# Patient Record
Sex: Female | Born: 1970 | Race: Black or African American | Hispanic: No | Marital: Married | Smoking: Never smoker
Health system: Southern US, Community
[De-identification: ages and names within clinical notes are randomized; demographics above are authoritative.]

## PROBLEM LIST (undated history)

## (undated) DIAGNOSIS — R87629 Unspecified abnormal cytological findings in specimens from vagina: Secondary | ICD-10-CM

## (undated) DIAGNOSIS — B009 Herpesviral infection, unspecified: Secondary | ICD-10-CM

## (undated) DIAGNOSIS — N6009 Solitary cyst of unspecified breast: Secondary | ICD-10-CM

## (undated) DIAGNOSIS — N809 Endometriosis, unspecified: Secondary | ICD-10-CM

## (undated) DIAGNOSIS — M810 Age-related osteoporosis without current pathological fracture: Secondary | ICD-10-CM

## (undated) DIAGNOSIS — K429 Umbilical hernia without obstruction or gangrene: Secondary | ICD-10-CM

## (undated) DIAGNOSIS — I10 Essential (primary) hypertension: Secondary | ICD-10-CM

## (undated) HISTORY — DX: Endometriosis, unspecified: N80.9

## (undated) HISTORY — DX: Herpesviral infection, unspecified: B00.9

## (undated) HISTORY — PX: OTHER SURGICAL HISTORY: SHX169

## (undated) HISTORY — DX: Solitary cyst of unspecified breast: N60.09

## (undated) HISTORY — PX: LEEP: SHX91

## (undated) HISTORY — DX: Umbilical hernia without obstruction or gangrene: K42.9

## (undated) HISTORY — DX: Unspecified abnormal cytological findings in specimens from vagina: R87.629

## (undated) HISTORY — PX: TUBAL LIGATION: SHX77

## (undated) HISTORY — PX: KNEE SURGERY: SHX244

## (undated) HISTORY — PX: HERNIA REPAIR: SHX51

## (undated) HISTORY — DX: Age-related osteoporosis without current pathological fracture: M81.0

---

## 2006-02-07 ENCOUNTER — Emergency Department (HOSPITAL_COMMUNITY): Admission: EM | Admit: 2006-02-07 | Discharge: 2006-02-07 | Payer: Self-pay | Admitting: Emergency Medicine

## 2007-10-11 ENCOUNTER — Encounter: Payer: Self-pay | Admitting: Maternal & Fetal Medicine

## 2007-11-18 HISTORY — PX: TUBAL LIGATION: SHX77

## 2011-07-15 ENCOUNTER — Ambulatory Visit: Payer: Self-pay | Admitting: Obstetrics and Gynecology

## 2011-09-17 ENCOUNTER — Ambulatory Visit: Payer: Self-pay | Admitting: Otolaryngology

## 2012-09-27 ENCOUNTER — Ambulatory Visit: Payer: Self-pay | Admitting: Surgery

## 2012-09-27 LAB — CBC WITH DIFFERENTIAL/PLATELET
Basophil #: 0.1 10*3/uL (ref 0.0–0.1)
HCT: 35.8 % (ref 35.0–47.0)
Lymphocyte %: 28.9 %
Monocyte %: 6.7 %
Platelet: 285 10*3/uL (ref 150–440)
RDW: 14 % (ref 11.5–14.5)
WBC: 9.2 10*3/uL (ref 3.6–11.0)

## 2012-09-27 LAB — BASIC METABOLIC PANEL
Anion Gap: 5 — ABNORMAL LOW (ref 7–16)
BUN: 18 mg/dL (ref 7–18)
Calcium, Total: 8.7 mg/dL (ref 8.5–10.1)
Chloride: 105 mmol/L (ref 98–107)
Creatinine: 0.72 mg/dL (ref 0.60–1.30)
Glucose: 72 mg/dL (ref 65–99)
Osmolality: 276 (ref 275–301)
Potassium: 3.8 mmol/L (ref 3.5–5.1)

## 2012-09-30 ENCOUNTER — Ambulatory Visit: Payer: Self-pay | Admitting: Surgery

## 2012-11-17 LAB — HM PAP SMEAR: HM Pap smear: NEGATIVE

## 2012-12-07 ENCOUNTER — Ambulatory Visit: Payer: Self-pay | Admitting: Obstetrics and Gynecology

## 2012-12-14 ENCOUNTER — Ambulatory Visit: Payer: Self-pay | Admitting: Obstetrics and Gynecology

## 2013-06-28 ENCOUNTER — Ambulatory Visit: Payer: Self-pay | Admitting: Obstetrics and Gynecology

## 2013-06-30 ENCOUNTER — Other Ambulatory Visit: Payer: Self-pay | Admitting: Obstetrics and Gynecology

## 2013-06-30 DIAGNOSIS — N63 Unspecified lump in unspecified breast: Secondary | ICD-10-CM

## 2013-06-30 DIAGNOSIS — N6009 Solitary cyst of unspecified breast: Secondary | ICD-10-CM

## 2013-07-04 ENCOUNTER — Other Ambulatory Visit: Payer: Self-pay

## 2013-07-15 ENCOUNTER — Ambulatory Visit
Admission: RE | Admit: 2013-07-15 | Discharge: 2013-07-15 | Disposition: A | Discharge status: Home / Self Care | Payer: BC Managed Care – PPO | Source: Ambulatory Visit | Attending: Obstetrics and Gynecology | Admitting: Obstetrics and Gynecology

## 2013-07-15 DIAGNOSIS — N63 Unspecified lump in unspecified breast: Secondary | ICD-10-CM

## 2013-07-15 DIAGNOSIS — N6009 Solitary cyst of unspecified breast: Secondary | ICD-10-CM

## 2013-07-15 MED ORDER — GADOBENATE DIMEGLUMINE 529 MG/ML IV SOLN
15.0000 mL | Freq: Once | INTRAVENOUS | Status: AC | PRN
  Administered 2013-07-15: 15 mL via INTRAVENOUS

## 2015-03-06 NOTE — Op Note (Signed)
PATIENT NAME:  Andrea Massey, Andrea Massey MR#:  [id number] DATE OF BIRTH:  [birth year]  DATE OF PROCEDURE:  09/30/2012  PREOPERATIVE DIAGNOSIS: Umbilical hernia.   POSTOPERATIVE DIAGNOSIS: Umbilical hernia.     PROCEDURE: Repair of umbilical hernia.   SURGEON: Dionne Miloichard Cailin Gebel, M.D.   ANESTHESIA: General with endotracheal tube.   INDICATIONS: This is a patient with an umbilical hernia requiring repair. Preoperatively, we discussed rationale for surgery, the options of observation, risks of bleeding, infection, recurrence, placement of mesh, mesh infection, and cosmetic deformity. This was all reviewed for her in the preop holding area. She understood and agreed to proceed.   FINDINGS: Umbilical hernia.   DESCRIPTION OF PROCEDURE: The patient was induced to general anesthesia, given IV antibiotics. VTE prophylaxis was in place. She was prepped and draped in a sterile fashion. Marcaine was infiltrated in skin and subcutaneous tissues around the periumbilical area. An incision was made utilizing her old laparoscopy incision in the infraumbilical area. The skin of the umbilicus was elevated and the fascia cleaned and the sac was reduced. Fascial edges were cleaned appropriately and then once assuring that no adhesions were present, the small umbilical hernia was closed with #1 Ethibond and multiple figure-of-eight sutures were utilized. Marcaine for a total of 30 mL was infiltrated into the fascia and subcutaneous tissues and then the skin of the umbilicus was tacked back to the fascia with 3-0 Vicryl. Deep sutures of 3-0 Vicryl were placed followed by 4-0 subcuticular Monocryl. Steri-Strips, Mastisol, and sterile dressings were placed. The patient tolerated the procedure well. There were no complications. She was taken to       the recovery room in stable condition to be discharged in the care of her family. Follow-up in 10 days. Sponge, lap and needle counts were correct.    ____________________________ Adah Salvageichard E. Excell Seltzerooper, MD rec:ap D: 09/30/2012 10:53:58 ET T: 09/30/2012 11:49:49 ET JOB#: [id number]  cc: Adah Salvageichard E. Excell Seltzerooper, MD, <Dictator> Lattie HawICHARD E Eliel Dudding MD ELECTRONICALLY SIGNED 09/30/2012 12:15

## 2015-04-04 ENCOUNTER — Other Ambulatory Visit: Payer: Self-pay | Admitting: Obstetrics and Gynecology

## 2015-04-04 DIAGNOSIS — Z01419 Encounter for gynecological examination (general) (routine) without abnormal findings: Secondary | ICD-10-CM

## 2016-04-09 ENCOUNTER — Encounter: Payer: Self-pay | Admitting: Obstetrics and Gynecology

## 2016-05-01 ENCOUNTER — Encounter: Payer: Self-pay | Admitting: Obstetrics and Gynecology

## 2016-06-03 ENCOUNTER — Ambulatory Visit (INDEPENDENT_AMBULATORY_CARE_PROVIDER_SITE_OTHER): Payer: Self-pay | Admitting: Obstetrics and Gynecology

## 2016-06-03 ENCOUNTER — Encounter: Payer: Self-pay | Admitting: Obstetrics and Gynecology

## 2016-06-03 VITALS — BP 120/76 | HR 76 | Ht 62.0 in | Wt 166.6 lb

## 2016-06-03 DIAGNOSIS — Z01419 Encounter for gynecological examination (general) (routine) without abnormal findings: Secondary | ICD-10-CM

## 2016-06-03 DIAGNOSIS — N809 Endometriosis, unspecified: Secondary | ICD-10-CM

## 2016-06-03 DIAGNOSIS — M810 Age-related osteoporosis without current pathological fracture: Secondary | ICD-10-CM

## 2016-06-03 DIAGNOSIS — Z1239 Encounter for other screening for malignant neoplasm of breast: Secondary | ICD-10-CM

## 2016-06-03 DIAGNOSIS — A6000 Herpesviral infection of urogenital system, unspecified: Secondary | ICD-10-CM

## 2016-06-03 NOTE — Patient Instructions (Signed)
1. Pap smear is done 2. Mammogram is ordered 3. Continue with healthy eating and exercise with 10 pound weight loss goal in 1 year 4. Continue with calcium and vitamin D supplementation 600 mg twice a day 5. Return in 1 year for physical

## 2016-06-03 NOTE — Progress Notes (Signed)
Patient ID: Andrea Massey, female   DOB: 06-Mar-1971, 45 y.o.   MRN: [id number] ANNUAL PREVENTATIVE CARE GYN  ENCOUNTER NOTE  Subjective:       Andrea Massey is a 45 y.o. [phone] female here for a routine annual gynecologic exam.  Current complaints: 1.  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 Contraception: tubal ligation Last Pap: 2014. Results were: normal Last mammogram: 2014. Results were: normal   Obstetric History OB History  Gravida Para Term Preterm AB SAB TAB Ectopic Multiple Living  3 2 2  1  1   2     # Outcome Date GA Lbr Len/2nd Weight Sex Delivery Anes PTL Lv  3 TAB           2 Term      Vag-Spont   Y  1 Term      VBAC   Y      Past Medical History  Diagnosis Date  . Osteoporosis   . Vaginal Pap smear, abnormal     dysplasia of cervix  . Endometriosis   . HSV infection   . Umbilical hernia   . Breast cyst     left    Past Surgical History  Procedure Laterality Date  . Knee surgery    . Tubal ligation    . Laparoscopy    . Leep    . Hernia repair      Current Outpatient Prescriptions on File Prior to Visit  Medication Sig Dispense Refill  . Multiple Vitamin (MULTIVITAMIN) capsule Take 1 capsule by mouth daily.     No current facility-administered medications on file prior to visit.    Allergies  Allergen Reactions  . Penicillins     Social History   Social History  . Marital Status: Single    Spouse Name: N/A  . Number of Children: N/A  . Years of Education: N/A   Occupational History  . Not on file.   Social History Main Topics  . Smoking status: Never Smoker   . Smokeless tobacco: Not on file  . Alcohol Use: Yes     Comment: occas  . Drug Use: No  . Sexual Activity: Yes    Birth Control/ Protection: Surgical   Other  Topics Concern  . Not on file   Social History Narrative    Family History  Problem Relation Age of Onset  . Cancer Neg Hx   . Diabetes Neg Hx   . Heart disease Neg Hx     The following portions of the patient's history were reviewed and updated as appropriate: allergies, current medications, past family history, past medical history, past social history, past surgical history and problem list.  Review of Systems ROS Review of Systems - General ROS: negative for - chills, fatigue, fever, hot flashes, night sweats, weight gain or weight loss Psychological ROS: negative for - anxiety, decreased libido, depression, mood swings, physical abuse or sexual abuse Ophthalmic ROS: negative for - blurry vision, eye pain or loss of vision ENT ROS: negative for - headaches, hearing change, visual changes or vocal changes Allergy and Immunology ROS: negative for - hives, itchy/watery eyes or seasonal allergies Hematological and Lymphatic ROS: negative for - bleeding problems, bruising, swollen lymph nodes or weight loss Endocrine ROS: negative for - galactorrhea, hair pattern changes, hot  flashes, malaise/lethargy, mood swings, palpitations, polydipsia/polyuria, skin changes, temperature intolerance or unexpected weight changes Breast ROS: negative for - new or changing breast lumps or nipple discharge Respiratory ROS: negative for - cough or shortness of breath Cardiovascular ROS: negative for - chest pain, irregular heartbeat, palpitations or shortness of breath Gastrointestinal ROS: no abdominal pain, change in bowel habits, or black or bloody stools Genito-Urinary ROS: no dysuria, trouble voiding, or hematuria Musculoskeletal ROS: negative for - joint pain or joint stiffness Neurological ROS: negative for - bowel and bladder control changes Dermatological ROS: negative for rash and skin lesion changes   Objective:   BP 120/76 mmHg  Pulse 76  Ht  (1.575 m)  Wt 166 lb 9.6 oz (75.569  kg)  BMI 30.46 kg/m2  LMP 05/24/2016 (Exact Date) CONSTITUTIONAL: Well-developed, well-nourished female in no acute distress.  PSYCHIATRIC: Normal mood and affect. Normal behavior. Normal judgment and thought content. NEUROLGIC: Alert and oriented to person, place, and time. Normal muscle tone coordination. No cranial nerve deficit noted. HENT:  Normocephalic, atraumatic, External right and left ear normal.  EYES: Conjunctivae and EOM are normal. No scleral icterus.  NECK: Normal range of motion, supple, no masses.  Normal thyroid.  SKIN: Skin is warm and dry. No rash noted. Not diaphoretic. No erythema. No pallor. CARDIOVASCULAR: Normal heart rate noted, regular rhythm, no murmur. RESPIRATORY: Clear to auscultation bilaterally. Effort and breath sounds normal, no problems with respiration noted. BREASTS: Symmetric in size. No masses, skin changes, nipple drainage, or lymphadenopathy. Left nipple is inverted, chronic ABDOMEN: Soft, normal bowel sounds, no distention noted.  No tenderness, rebound or guarding. Transverse umbilical hernia repair scar  BLADDER: Normal PELVIC:  External Genitalia: Normal  BUS: Normal  Vagina: Normal  Cervix: Normal  Uterus: Normal; midplane, normal size and shape, mobile, nontender  Adnexa: Normal  RV: External Exam NormaI, No Rectal Masses and Normal Sphincter tone  MUSCULOSKELETAL: Normal range of motion. No tenderness.  No cyanosis, clubbing, or edema.  2+ distal pulses. LYMPHATIC: No Axillary, Supraclavicular, or Inguinal Adenopathy.    Assessment:   Annual gynecologic examination 45 y.o. Contraception: tubal ligation bmi- 29 Endometriosis, stable History of HSV, no recent outbreaks History of osteoporosis   Plan:  Pap: Pap Co Test Mammogram: Ordered Stool Guaiac Testing:  Not Indicated Labs: thru pcp Routine preventative health maintenance measures emphasized: Exercise/Diet/Weight control, Tobacco Warnings and Alcohol/Substance use  risks Continue calcium and vitamin D supplementation Return to Clinic - 1 Year   Crystal Fate, CMA  Herold Harms, MD  Note: This dictation was prepared with Dragon dictation along with smaller phrase technology. Any transcriptional errors that result from this process are unintentional.

## 2016-06-05 LAB — PAP IG AND HPV HIGH-RISK
HPV, HIGH-RISK: NEGATIVE
PAP SMEAR COMMENT: 0

## 2017-06-08 NOTE — Progress Notes (Signed)
He has been Patient ID: Andrea Massey, female   DOB: Aug 21, 1971, 46 y.o.   MRN: [id number] ANNUAL PREVENTATIVE CARE GYN  ENCOUNTER NOTE  Subjective:       Andrea Massey is a 46 y.o. [phone] female here for a routine annual gynecologic exam.  Current complaints: 1.  none   Cycles are monthly but shorter, lasting only 3 days. No significant pelvic pain. Bowel function is normal. Bladder function is normal. Patient is in need of an interval bone density scan due to history of osteoporosis without recent evaluation  Gynecologic History No LMP recorded. Contraception: tubal ligation Last Pap: 05/2016 NEG/NEG  Results were: normal Last mammogram: 2014 B[facility] 2 Results were: normal   Obstetric History OB History  Gravida Para Term Preterm AB Living  3 2 2   1 2   SAB TAB Ectopic Multiple Live Births    1     2    # Outcome Date GA Lbr Len/2nd Weight Sex Delivery Anes PTL Lv  3 TAB           2 Term      Vag-Spont   LIV  1 Term      VBAC   LIV      Past Medical History:  Diagnosis Date  . Breast cyst    left  . Endometriosis   . HSV infection   . Osteoporosis   . Umbilical hernia   . Vaginal Pap smear, abnormal    dysplasia of cervix    Past Surgical History:  Procedure Laterality Date  . HERNIA REPAIR    . KNEE SURGERY    . laparoscopy    . LEEP    . TUBAL LIGATION      Current Outpatient Prescriptions on File Prior to Visit  Medication Sig Dispense Refill  . fluticasone (FLONASE) 50 MCG/ACT nasal spray Place into the nose.    . loratadine (ALLERGY) 10 MG tablet Take 10 mg by mouth daily.    . Multiple Vitamin (MULTIVITAMIN) capsule Take 1 capsule by mouth daily.     No current facility-administered medications on file prior to visit.     Allergies  Allergen Reactions  . Penicillins     Social History   Social History  . Marital status: Single    Spouse name: N/A  . Number of children: N/A  . Years of education: N/A   Occupational  History  . Not on file.   Social History Main Topics  . Smoking status: Never Smoker  . Smokeless tobacco: Not on file  . Alcohol use Yes     Comment: occas  . Drug use: No  . Sexual activity: Yes    Birth control/ protection: Surgical   Other Topics Concern  . Not on file   Social History Narrative  . No narrative on file    Family History  Problem Relation Age of Onset  . Cancer Neg Hx   . Diabetes Neg Hx   . Heart disease Neg Hx     The following portions of the patient's history were reviewed and updated as appropriate: allergies, current medications, past family history, past medical history, past social history, past surgical history and problem list.  Review of Systems Review of Systems  Constitutional:       Occasional vasomotor symptom  Eyes: Negative.   Respiratory: Negative.   Cardiovascular: Negative.   Gastrointestinal: Negative.   Genitourinary: Negative.   Musculoskeletal: Negative.  History of osteoporosis  Skin: Negative.   Neurological: Negative.   Endo/Heme/Allergies: Negative.   Psychiatric/Behavioral: Negative.       Objective:  BP 132/84   Pulse 77   Ht 5\' 2"  (1.575 m)   Wt 169 lb 11.2 oz (77 kg)   LMP 06/03/2017 (Exact Date)   BMI 31.04 kg/m  CONSTITUTIONAL: Well-developed, well-nourished female in no acute distress.  PSYCHIATRIC: Normal mood and affect. Normal behavior. Normal judgment and thought content. NEUROLGIC: Alert and oriented to person, place, and time. Normal muscle tone coordination. No cranial nerve deficit noted. HENT:  Normocephalic, atraumatic, External right and left ear normal.  EYES: Conjunctivae and EOM are normal. No scleral icterus.  NECK: Normal range of motion, supple, no masses.  Normal thyroid.  SKIN: Skin is warm and dry. No rash noted. Not diaphoretic. No erythema. No pallor. CARDIOVASCULAR: Normal heart rate noted, regular rhythm, no murmur. RESPIRATORY: Clear to auscultation bilaterally. Effort  and breath sounds normal, no problems with respiration noted. BREASTS: Symmetric in size. No masses, skin changes, nipple drainage, or lymphadenopathy. Left nipple is inverted, chronic Without change ABDOMEN: Soft, normal bowel sounds, no distention noted.  No tenderness, rebound or guarding. Transverse umbilical hernia repair scar  BLADDER: Normal PELVIC:  External Genitalia: Normal  BUS: Normal  Vagina: Normal  Cervix: Normal; no cervical motion tenderness; no lesions  Uterus: Normal; midplane, normal size and shape, mobile, nontender  Adnexa: Normal  RV: External Exam NormaI, No Rectal Masses and Normal Sphincter tone  MUSCULOSKELETAL: Normal range of motion. No tenderness.  No cyanosis, clubbing, or edema.  2+ distal pulses. LYMPHATIC: No Axillary, Supraclavicular, or Inguinal Adenopathy.    Assessment:   Annual gynecologic examination 46 y.o. Contraception: tubal ligation bmi- 31 Endometriosis, stable History of HSV, no recent outbreaks History of osteoporosis   Plan:  Pap: Due- 2020 Mammogram: Ordered Stool Guaiac Testing:  Not Indicated Labs: thru pcp Routine preventative health maintenance measures emphasized: Exercise/Diet/Weight control, Tobacco Warnings and Alcohol/Substance use risks Continue calcium and vitamin D supplementation DEXA scan is ordered Return to Clinic - 1 Year   [employer], CMA  Herold Harms, MD   Note: This dictation was prepared with Dragon dictation along with smaller phrase technology. Any transcriptional errors that result from this process are unintentional.

## 2017-06-09 ENCOUNTER — Ambulatory Visit (INDEPENDENT_AMBULATORY_CARE_PROVIDER_SITE_OTHER): Payer: BLUE CROSS/BLUE SHIELD | Admitting: Obstetrics and Gynecology

## 2017-06-09 ENCOUNTER — Encounter: Payer: Self-pay | Admitting: Obstetrics and Gynecology

## 2017-06-09 VITALS — BP 132/84 | HR 77 | Ht 62.0 in | Wt 169.7 lb

## 2017-06-09 DIAGNOSIS — M810 Age-related osteoporosis without current pathological fracture: Secondary | ICD-10-CM

## 2017-06-09 DIAGNOSIS — Z1231 Encounter for screening mammogram for malignant neoplasm of breast: Secondary | ICD-10-CM | POA: Diagnosis not present

## 2017-06-09 DIAGNOSIS — N809 Endometriosis, unspecified: Secondary | ICD-10-CM | POA: Diagnosis not present

## 2017-06-09 DIAGNOSIS — A6000 Herpesviral infection of urogenital system, unspecified: Secondary | ICD-10-CM | POA: Diagnosis not present

## 2017-06-09 DIAGNOSIS — Z01419 Encounter for gynecological examination (general) (routine) without abnormal findings: Secondary | ICD-10-CM | POA: Diagnosis not present

## 2017-06-09 DIAGNOSIS — Z1239 Encounter for other screening for malignant neoplasm of breast: Secondary | ICD-10-CM

## 2017-06-09 NOTE — Patient Instructions (Signed)
1. No Pap smear until 2020 2. Mammogram ordered 3. DEXA scan is ordered to assess osteoporosis course 4. Continue with healthy eating, exercise, and controlled weight loss 5. Continue with calcium and vitamin D supplementation 6. Screening labs are ordered 7. Return in 1 year for annual exam  Health Maintenance, Female Adopting a healthy lifestyle and getting preventive care can go a long way to promote health and wellness. Talk with your health care provider about what schedule of regular examinations is right for you. This is a good chance for you to check in with your provider about disease prevention and staying healthy. In between checkups, there are plenty of things you can do on your own. Experts have done a lot of research about which lifestyle changes and preventive measures are most likely to keep you healthy. Ask your health care provider for more information. Weight and diet Eat a healthy diet  Be sure to include plenty of vegetables, fruits, low-fat dairy products, and lean protein.  Do not eat a lot of foods high in solid fats, added sugars, or salt.  Get regular exercise. This is one of the most important things you can do for your health. ? Most adults should exercise for at least 150 minutes each week. The exercise should increase your heart rate and make you sweat (moderate-intensity exercise). ? Most adults should also do strengthening exercises at least twice a week. This is in addition to the moderate-intensity exercise.  Maintain a healthy weight  Body mass index (BMI) is a measurement that can be used to identify possible weight problems. It estimates body fat based on height and weight. Your health care provider can help determine your BMI and help you achieve or maintain a healthy weight.  For females 64 years of age and older: ? A BMI below 18.5 is considered underweight. ? A BMI of 18.5 to 24.9 is normal. ? A BMI of 25 to 29.9 is considered overweight. ? A BMI  of 30 and above is considered obese.  Watch levels of cholesterol and blood lipids  You should start having your blood tested for lipids and cholesterol at 47 years of age, then have this test every 5 years.  You may need to have your cholesterol levels checked more often if: ? Your lipid or cholesterol levels are high. ? You are older than 47 years of age. ? You are at high risk for heart disease.  Cancer screening Lung Cancer  Lung cancer screening is recommended for adults 42-40 years old who are at high risk for lung cancer because of a history of smoking.  A yearly low-dose CT scan of the lungs is recommended for people who: ? Currently smoke. ? Have quit within the past 15 years. ? Have at least a 30-pack-year history of smoking. A pack year is smoking an average of one pack of cigarettes a day for 1 year.  Yearly screening should continue until it has been 15 years since you quit.  Yearly screening should stop if you develop a health problem that would prevent you from having lung cancer treatment.  Breast Cancer  Practice breast self-awareness. This means understanding how your breasts normally appear and feel.  It also means doing regular breast self-exams. Let your health care provider know about any changes, no matter how small.  If you are in your 20s or 30s, you should have a clinical breast exam (CBE) by a health care provider every 1-3 years as part of a  regular health exam.  If you are 40 or older, have a CBE every year. Also consider having a breast X-ray (mammogram) every year.  If you have a family history of breast cancer, talk to your health care provider about genetic screening.  If you are at high risk for breast cancer, talk to your health care provider about having an MRI and a mammogram every year.  Breast cancer gene (BRCA) assessment is recommended for women who have family members with BRCA-related cancers. BRCA-related cancers  include: ? Breast. ? Ovarian. ? Tubal. ? Peritoneal cancers.  Results of the assessment will determine the need for genetic counseling and BRCA1 and BRCA2 testing.  Cervical Cancer Your health care provider may recommend that you be screened regularly for cancer of the pelvic organs (ovaries, uterus, and vagina). This screening involves a pelvic examination, including checking for microscopic changes to the surface of your cervix (Pap test). You may be encouraged to have this screening done every 3 years, beginning at age 74.  For women ages 34-65, health care providers may recommend pelvic exams and Pap testing every 3 years, or they may recommend the Pap and pelvic exam, combined with testing for human papilloma virus (HPV), every 5 years. Some types of HPV increase your risk of cervical cancer. Testing for HPV may also be done on women of any age with unclear Pap test results.  Other health care providers may not recommend any screening for nonpregnant women who are considered low risk for pelvic cancer and who do not have symptoms. Ask your health care provider if a screening pelvic exam is right for you.  If you have had past treatment for cervical cancer or a condition that could lead to cancer, you need Pap tests and screening for cancer for at least 20 years after your treatment. If Pap tests have been discontinued, your risk factors (such as having a new sexual partner) need to be reassessed to determine if screening should resume. Some women have medical problems that increase the chance of getting cervical cancer. In these cases, your health care provider may recommend more frequent screening and Pap tests.  Colorectal Cancer  This type of cancer can be detected and often prevented.  Routine colorectal cancer screening usually begins at 47 years of age and continues through 47 years of age.  Your health care provider may recommend screening at an earlier age if you have risk factors  for colon cancer.  Your health care provider may also recommend using home test kits to check for hidden blood in the stool.  A small camera at the end of a tube can be used to examine your colon directly (sigmoidoscopy or colonoscopy). This is done to check for the earliest forms of colorectal cancer.  Routine screening usually begins at age 70.  Direct examination of the colon should be repeated every 5-10 years through 47 years of age. However, you may need to be screened more often if early forms of precancerous polyps or small growths are found.  Skin Cancer  Check your skin from head to toe regularly.  Tell your health care provider about any new moles or changes in moles, especially if there is a change in a mole's shape or color.  Also tell your health care provider if you have a mole that is larger than the size of a pencil eraser.  Always use sunscreen. Apply sunscreen liberally and repeatedly throughout the day.  Protect yourself by wearing long sleeves, pants,  a wide-brimmed hat, and sunglasses whenever you are outside.  Heart disease, diabetes, and high blood pressure  High blood pressure causes heart disease and increases the risk of stroke. High blood pressure is more likely to develop in: ? People who have blood pressure in the high end of the normal range (130-139/85-89 mm Hg). ? People who are overweight or obese. ? People who are African American.  If you are 82-70 years of age, have your blood pressure checked every 3-5 years. If you are 56 years of age or older, have your blood pressure checked every year. You should have your blood pressure measured twice-once when you are at a hospital or clinic, and once when you are not at a hospital or clinic. Record the average of the two measurements. To check your blood pressure when you are not at a hospital or clinic, you can use: ? An automated blood pressure machine at a pharmacy. ? A home blood pressure monitor.  If  you are between 62 years and 4 years old, ask your health care provider if you should take aspirin to prevent strokes.  Have regular diabetes screenings. This involves taking a blood sample to check your fasting blood sugar level. ? If you are at a normal weight and have a low risk for diabetes, have this test once every three years after 47 years of age. ? If you are overweight and have a high risk for diabetes, consider being tested at a younger age or more often. Preventing infection Hepatitis B  If you have a higher risk for hepatitis B, you should be screened for this virus. You are considered at high risk for hepatitis B if: ? You were born in a country where hepatitis B is common. Ask your health care provider which countries are considered high risk. ? Your parents were born in a high-risk country, and you have not been immunized against hepatitis B (hepatitis B vaccine). ? You have HIV or AIDS. ? You use needles to inject street drugs. ? You live with someone who has hepatitis B. ? You have had sex with someone who has hepatitis B. ? You get hemodialysis treatment. ? You take certain medicines for conditions, including cancer, organ transplantation, and autoimmune conditions.  Hepatitis C  Blood testing is recommended for: ? Everyone born from [birth year] through 1966. ? Anyone with known risk factors for hepatitis C.  Sexually transmitted infections (STIs)  You should be screened for sexually transmitted infections (STIs) including gonorrhea and chlamydia if: ? You are sexually active and are younger than 47 years of age. ? You are older than 47 years of age and your health care provider tells you that you are at risk for this type of infection. ? Your sexual activity has changed since you were last screened and you are at an increased risk for chlamydia or gonorrhea. Ask your health care provider if you are at risk.  If you do not have HIV, but are at risk, it may be recommended  that you take a prescription medicine daily to prevent HIV infection. This is called pre-exposure prophylaxis (PrEP). You are considered at risk if: ? You are sexually active and do not regularly use condoms or know the HIV status of your partner(s). ? You take drugs by injection. ? You are sexually active with a partner who has HIV.  Talk with your health care provider about whether you are at high risk of being infected with HIV. If you choose  to begin PrEP, you should first be tested for HIV. You should then be tested every 3 months for as long as you are taking PrEP. Pregnancy  If you are premenopausal and you may become pregnant, ask your health care provider about preconception counseling.  If you may become pregnant, take 400 to 800 micrograms (mcg) of folic acid every day.  If you want to prevent pregnancy, talk to your health care provider about birth control (contraception). Osteoporosis and menopause  Osteoporosis is a disease in which the bones lose minerals and strength with aging. This can result in serious bone fractures. Your risk for osteoporosis can be identified using a bone density scan.  If you are 26 years of age or older, or if you are at risk for osteoporosis and fractures, ask your health care provider if you should be screened.  Ask your health care provider whether you should take a calcium or vitamin D supplement to lower your risk for osteoporosis.  Menopause may have certain physical symptoms and risks.  Hormone replacement therapy may reduce some of these symptoms and risks. Talk to your health care provider about whether hormone replacement therapy is right for you. Follow these instructions at home:  Schedule regular health, dental, and eye exams.  Stay current with your immunizations.  Do not use any tobacco products including cigarettes, chewing tobacco, or electronic cigarettes.  If you are pregnant, do not drink alcohol.  If you are  breastfeeding, limit how much and how often you drink alcohol.  Limit alcohol intake to no more than 1 drink per day for nonpregnant women. One drink equals 12 ounces of beer, 5 ounces of wine, or 1 ounces of hard liquor.  Do not use street drugs.  Do not share needles.  Ask your health care provider for help if you need support or information about quitting drugs.  Tell your health care provider if you often feel depressed.  Tell your health care provider if you have ever been abused or do not feel safe at home. This information is not intended to replace advice given to you by your health care provider. Make sure you discuss any questions you have with your health care provider. Document Released: 05/19/2011 Document Revised: 04/10/2016 Document Reviewed: 08/07/2015 Elsevier Interactive Patient Education  [employer].

## 2017-06-17 ENCOUNTER — Other Ambulatory Visit: Payer: BLUE CROSS/BLUE SHIELD

## 2017-06-18 LAB — LIPID PANEL
CHOLESTEROL TOTAL: 229 mg/dL — AB (ref 100–199)
Chol/HDL Ratio: 2.6 ratio (ref 0.0–4.4)
HDL: 88 mg/dL (ref >39)
LDL Calculated: 129 mg/dL — ABNORMAL HIGH (ref 0–99)
Triglycerides: 62 mg/dL (ref 0–149)
VLDL Cholesterol Cal: 12 mg/dL (ref 5–40)

## 2017-06-18 LAB — HEMOGLOBIN A1C
Est. average glucose Bld gHb Est-mCnc: 111 mg/dL
HEMOGLOBIN A1C: 5.5 % (ref 4.8–5.6)

## 2017-06-18 LAB — GLUCOSE, RANDOM: Glucose: 83 mg/dL (ref 65–99)

## 2017-06-18 LAB — TSH: TSH: 2.15 u[IU]/mL (ref 0.450–4.500)

## 2017-09-28 ENCOUNTER — Ambulatory Visit
Admission: RE | Admit: 2017-09-28 | Discharge: 2017-09-28 | Disposition: A | Discharge status: Home / Self Care | Payer: BLUE CROSS/BLUE SHIELD | Source: Ambulatory Visit | Attending: Obstetrics and Gynecology | Admitting: Obstetrics and Gynecology

## 2017-09-28 DIAGNOSIS — M810 Age-related osteoporosis without current pathological fracture: Secondary | ICD-10-CM

## 2017-09-28 DIAGNOSIS — Z1231 Encounter for screening mammogram for malignant neoplasm of breast: Secondary | ICD-10-CM | POA: Insufficient documentation

## 2017-09-28 DIAGNOSIS — M85851 Other specified disorders of bone density and structure, right thigh: Secondary | ICD-10-CM | POA: Diagnosis not present

## 2017-09-28 DIAGNOSIS — Z1239 Encounter for other screening for malignant neoplasm of breast: Secondary | ICD-10-CM

## 2017-09-28 DIAGNOSIS — M25559 Pain in unspecified hip: Secondary | ICD-10-CM | POA: Diagnosis not present

## 2017-09-28 DIAGNOSIS — Z8262 Family history of osteoporosis: Secondary | ICD-10-CM | POA: Insufficient documentation

## 2017-11-17 HISTORY — PX: CHOLECYSTECTOMY: SHX55

## 2018-05-04 ENCOUNTER — Encounter: Payer: Self-pay | Admitting: Obstetrics and Gynecology

## 2018-06-10 ENCOUNTER — Encounter: Payer: Self-pay | Admitting: Obstetrics and Gynecology

## 2018-06-10 ENCOUNTER — Ambulatory Visit (INDEPENDENT_AMBULATORY_CARE_PROVIDER_SITE_OTHER): Payer: BLUE CROSS/BLUE SHIELD | Admitting: Obstetrics and Gynecology

## 2018-06-10 VITALS — BP 123/83 | HR 76 | Ht 62.0 in | Wt 168.0 lb

## 2018-06-10 DIAGNOSIS — Z01419 Encounter for gynecological examination (general) (routine) without abnormal findings: Secondary | ICD-10-CM | POA: Diagnosis not present

## 2018-06-10 DIAGNOSIS — N809 Endometriosis, unspecified: Secondary | ICD-10-CM

## 2018-06-10 DIAGNOSIS — N926 Irregular menstruation, unspecified: Secondary | ICD-10-CM

## 2018-06-10 DIAGNOSIS — Z1231 Encounter for screening mammogram for malignant neoplasm of breast: Secondary | ICD-10-CM | POA: Diagnosis not present

## 2018-06-10 DIAGNOSIS — Z1239 Encounter for other screening for malignant neoplasm of breast: Secondary | ICD-10-CM

## 2018-06-10 DIAGNOSIS — M8580 Other specified disorders of bone density and structure, unspecified site: Secondary | ICD-10-CM

## 2018-06-10 DIAGNOSIS — A6000 Herpesviral infection of urogenital system, unspecified: Secondary | ICD-10-CM | POA: Diagnosis not present

## 2018-06-10 NOTE — Progress Notes (Signed)
Pt stated that she is doing well and have no complaints.   ANNUAL PREVENTATIVE CARE GYN  ENCOUNTER NOTE  Subjective:       Andrea Massey is a 47 y.o. [id number] female here for a routine annual gynecologic exam.  Current complaints: 1.  Annual physical  Patient reports no major interval health issues.  Bowel and bladder function are normal.  She did have her gallbladder removed this past winter. Major concern today is irregular menstrual cycles with intervals ranging from 42 to 64 days.  She is not experiencing any significant vasomotor symptoms at this time. DEXA scan last year demonstrated osteopenia which is an improvement from her diagnosis of osteoporosis.  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 She has not had any herpes genitalis outbreaks this year.   Gynecologic History No LMP recorded. Contraception: tubal ligation Last Pap: 05/2016 NEG/NEG  Results were: normal Last mammogram: 09/28/2017 BI-RADS 1 Last DEXA scan: 09/28/2017 osteopenia (T score -1.7 femoral neck)  Obstetric History OB History  Gravida Para Term Preterm AB Living  3 2 2   1 2   SAB TAB Ectopic Multiple Live Births    1     2    # Outcome Date GA Lbr Len/2nd Weight Sex Delivery Anes PTL Lv  3 TAB           2 Term      Vag-Spont   LIV  1 Term      VBAC   LIV    Past Medical History:  Diagnosis Date  . Breast cyst    left  . Endometriosis   . HSV infection   . Osteoporosis   . Umbilical hernia   . Vaginal Pap smear, abnormal    dysplasia of cervix    Past Surgical History:  Procedure Laterality Date  . HERNIA REPAIR    . KNEE SURGERY    . laparoscopy    . LEEP    . TUBAL LIGATION      Current Outpatient Medications on File Prior to Visit  Medication Sig Dispense Refill  . fluticasone (FLONASE) 50 MCG/ACT nasal spray Place into the nose.    . loratadine (ALLERGY) 10 MG tablet Take 10 mg by mouth daily.    . Multiple Vitamin  (MULTIVITAMIN) capsule Take 1 capsule by mouth daily.     No current facility-administered medications on file prior to visit.     Allergies  Allergen Reactions  . Amoxicillin   . Penicillins     Social History   Socioeconomic History  . Marital status: Single    Spouse name: Not on file  . Number of children: Not on file  . Years of education: Not on file  . Highest education level: Not on file  Occupational History  . Not on file  Social Needs  . Financial resource strain: Not on file  . Food insecurity:    Worry: Not on file    Inability: Not on file  . Transportation needs:    Medical: Not on file    Non-medical: Not on file  Tobacco Use  . Smoking status: Never Smoker  . Smokeless tobacco: Never Used  Substance and Sexual Activity  . Alcohol use: Yes    Comment: occas  . Drug use: No  . Sexual activity: Yes    Birth control/protection: Surgical  Lifestyle  . Physical activity:    Days per week: Not on file  Minutes per session: Not on file  . Stress: Not on file  Relationships  . Social connections:    Talks on phone: Not on file    Gets together: Not on file    Attends religious service: Not on file    Active member of club or organization: Not on file    Attends meetings of clubs or organizations: Not on file    Relationship status: Not on file  . Intimate partner violence:    Fear of current or ex partner: Not on file    Emotionally abused: Not on file    Physically abused: Not on file    Forced sexual activity: Not on file  Other Topics Concern  . Not on file  Social History Narrative  . Not on file    Family History  Problem Relation Age of Onset  . Cancer Neg Hx   . Diabetes Neg Hx   . Heart disease Neg Hx     The following portions of the patient's history were reviewed and updated as appropriate: allergies, current medications, past family history, past medical history, past social history, past surgical history and problem  list.  Review of Systems Review of Systems  Constitutional:       Occasional hot flash  HENT: Negative.   Eyes: Negative.   Respiratory: Negative.   Cardiovascular: Negative.   Genitourinary: Negative.   Musculoskeletal: Negative.   Skin: Negative.   Neurological: Negative.   Endo/Heme/Allergies: Negative.   Psychiatric/Behavioral: Negative.     Objective:   BP 123/83   Pulse 76   Ht 5\' 2"  (1.575 m)   Wt 168 lb (76.2 kg)   LMP 05/08/2018   BMI 30.73 kg/m  CONSTITUTIONAL: Well-developed, well-nourished female in no acute distress.  PSYCHIATRIC: Normal mood and affect. Normal behavior. Normal judgment and thought content. NEUROLGIC: Alert and oriented to person, place, and time. Normal muscle tone coordination. No cranial nerve deficit noted. HENT:  Normocephalic, atraumatic, External right and left ear normal.  EYES: Conjunctivae and EOM are normal. No scleral icterus.  NECK: Normal range of motion, supple, no masses.  Normal thyroid.  SKIN: Skin is warm and dry. No rash noted. Not diaphoretic. No erythema. No pallor. CARDIOVASCULAR: Normal heart rate noted, regular rhythm, no murmur. RESPIRATORY: Clear to auscultation bilaterally. Effort and breath sounds normal, no problems with respiration noted. BREASTS: Symmetric in size. No masses, skin changes, nipple drainage, or lymphadenopathy. ABDOMEN: Soft, normal bowel sounds, no distention noted.  No tenderness, rebound or guarding.  Gallbladder laparoscopy scars are healed with small keloids present; no hernias BLADDER: Normal PELVIC:  External Genitalia: Normal  BUS: Normal  Vagina: Normal estrogen effect  Cervix: Normal; no lesions; no discharge; no cervical motion tenderness  Uterus: Normal; midplane, normal size and shape, mobile, nontender  Adnexa: Normal; nonpalpable nontender  RV: External Exam NormaI, No Rectal Masses and Normal Sphincter tone  MUSCULOSKELETAL: Normal range of motion. No tenderness.  No cyanosis,  clubbing, or edema.  2+ distal pulses. LYMPHATIC: No Axillary, Supraclavicular, or Inguinal Adenopathy.    Assessment:   Annual gynecologic examination 47 y.o. 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 Any transcriptional errors that result from this process are unintentional.

## 2018-06-10 NOTE — Patient Instructions (Signed)
1.  Pap smear is not done.  Next Pap smear is due in 2020. 2.  Mammogram is ordered. 3.  Screening labs are ordered. 4.  Continue with healthy eating and exercise. 5.  Continue with calcium and vitamin D supplementation twice a day 6.  Return in 1 year for annual exam  Health Maintenance, Female Adopting a healthy lifestyle and getting preventive care can go a long way to promote health and wellness. Talk with your health care provider about what schedule of regular examinations is right for you. This is a good chance for you to check in with your provider about disease prevention and staying healthy. In between checkups, there are plenty of things you can do on your own. Experts have done a lot of research about which lifestyle changes and preventive measures are most likely to keep you healthy. Ask your health care provider for more information. Weight and diet Eat a healthy diet  Be sure to include plenty of vegetables, fruits, low-fat dairy products, and lean protein.  Do not eat a lot of foods high in solid fats, added sugars, or salt.  Get regular exercise. This is one of the most important things you can do for your health. ? Most adults should exercise for at least 150 minutes each week. The exercise should increase your heart rate and make you sweat (moderate-intensity exercise). ? Most adults should also do strengthening exercises at least twice a week. This is in addition to the moderate-intensity exercise.  Maintain a healthy weight  Body mass index (BMI) is a measurement that can be used to identify possible weight problems. It estimates body fat based on height and weight. Your health care provider can help determine your BMI and help you achieve or maintain a healthy weight.  For females 48 years of age and older: ? A BMI below 18.5 is considered underweight. ? A BMI of 18.5 to 24.9 is normal. ? A BMI of 25 to 29.9 is considered overweight. ? A BMI of 30 and above is  considered obese.  Watch levels of cholesterol and blood lipids  You should start having your blood tested for lipids and cholesterol at 48 years of age, then have this test every 5 years.  You may need to have your cholesterol levels checked more often if: ? Your lipid or cholesterol levels are high. ? You are older than 48 years of age. ? You are at high risk for heart disease.  Cancer screening Lung Cancer  Lung cancer screening is recommended for adults 33-88 years old who are at high risk for lung cancer because of a history of smoking.  A yearly low-dose CT scan of the lungs is recommended for people who: ? Currently smoke. ? Have quit within the past 15 years. ? Have at least a 30-pack-year history of smoking. A pack year is smoking an average of one pack of cigarettes a day for 1 year.  Yearly screening should continue until it has been 15 years since you quit.  Yearly screening should stop if you develop a health problem that would prevent you from having lung cancer treatment.  Breast Cancer  Practice breast self-awareness. This means understanding how your breasts normally appear and feel.  It also means doing regular breast self-exams. Let your health care provider know about any changes, no matter how small.  If you are in your 20s or 30s, you should have a clinical breast exam (CBE) by a health care provider every  1-3 years as part of a regular health exam.  If you are 23 or older, have a CBE every year. Also consider having a breast X-ray (mammogram) every year.  If you have a family history of breast cancer, talk to your health care provider about genetic screening.  If you are at high risk for breast cancer, talk to your health care provider about having an MRI and a mammogram every year.  Breast cancer gene (BRCA) assessment is recommended for women who have family members with BRCA-related cancers. BRCA-related cancers  include: ? Breast. ? Ovarian. ? Tubal. ? Peritoneal cancers.  Results of the assessment will determine the need for genetic counseling and BRCA1 and BRCA2 testing.  Cervical Cancer Your health care provider may recommend that you be screened regularly for cancer of the pelvic organs (ovaries, uterus, and vagina). This screening involves a pelvic examination, including checking for microscopic changes to the surface of your cervix (Pap test). You may be encouraged to have this screening done every 3 years, beginning at age 71.  For women ages 51-65, health care providers may recommend pelvic exams and Pap testing every 3 years, or they may recommend the Pap and pelvic exam, combined with testing for human papilloma virus (HPV), every 5 years. Some types of HPV increase your risk of cervical cancer. Testing for HPV may also be done on women of any age with unclear Pap test results.  Other health care providers may not recommend any screening for nonpregnant women who are considered low risk for pelvic cancer and who do not have symptoms. Ask your health care provider if a screening pelvic exam is right for you.  If you have had past treatment for cervical cancer or a condition that could lead to cancer, you need Pap tests and screening for cancer for at least 20 years after your treatment. If Pap tests have been discontinued, your risk factors (such as having a new sexual partner) need to be reassessed to determine if screening should resume. Some women have medical problems that increase the chance of getting cervical cancer. In these cases, your health care provider may recommend more frequent screening and Pap tests.  Colorectal Cancer  This type of cancer can be detected and often prevented.  Routine colorectal cancer screening usually begins at 48 years of age and continues through 48 years of age.  Your health care provider may recommend screening at an earlier age if you have risk factors  for colon cancer.  Your health care provider may also recommend using home test kits to check for hidden blood in the stool.  A small camera at the end of a tube can be used to examine your colon directly (sigmoidoscopy or colonoscopy). This is done to check for the earliest forms of colorectal cancer.  Routine screening usually begins at age 30.  Direct examination of the colon should be repeated every 5-10 years through 48 years of age. However, you may need to be screened more often if early forms of precancerous polyps or small growths are found.  Skin Cancer  Check your skin from head to toe regularly.  Tell your health care provider about any new moles or changes in moles, especially if there is a change in a mole's shape or color.  Also tell your health care provider if you have a mole that is larger than the size of a pencil eraser.  Always use sunscreen. Apply sunscreen liberally and repeatedly throughout the day.  Protect  yourself by wearing long sleeves, pants, a wide-brimmed hat, and sunglasses whenever you are outside.  Heart disease, diabetes, and high blood pressure  High blood pressure causes heart disease and increases the risk of stroke. High blood pressure is more likely to develop in: ? People who have blood pressure in the high end of the normal range (130-139/85-89 mm Hg). ? People who are overweight or obese. ? People who are African American.  If you are 76-29 years of age, have your blood pressure checked every 3-5 years. If you are 55 years of age or older, have your blood pressure checked every year. You should have your blood pressure measured twice-once when you are at a hospital or clinic, and once when you are not at a hospital or clinic. Record the average of the two measurements. To check your blood pressure when you are not at a hospital or clinic, you can use: ? An automated blood pressure machine at a pharmacy. ? A home blood pressure monitor.  If  you are between 64 years and 74 years old, ask your health care provider if you should take aspirin to prevent strokes.  Have regular diabetes screenings. This involves taking a blood sample to check your fasting blood sugar level. ? If you are at a normal weight and have a low risk for diabetes, have this test once every three years after 48 years of age. ? If you are overweight and have a high risk for diabetes, consider being tested at a younger age or more often. Preventing infection Hepatitis B  If you have a higher risk for hepatitis B, you should be screened for this virus. You are considered at high risk for hepatitis B if: ? You were born in a country where hepatitis B is common. Ask your health care provider which countries are considered high risk. ? Your parents were born in a high-risk country, and you have not been immunized against hepatitis B (hepatitis B vaccine). ? You have HIV or AIDS. ? You use needles to inject street drugs. ? You live with someone who has hepatitis B. ? You have had sex with someone who has hepatitis B. ? You get hemodialysis treatment. ? You take certain medicines for conditions, including cancer, organ transplantation, and autoimmune conditions.  Hepatitis C  Blood testing is recommended for: ? Everyone born from [birth year] through 1966. ? Anyone with known risk factors for hepatitis C.  Sexually transmitted infections (STIs)  You should be screened for sexually transmitted infections (STIs) including gonorrhea and chlamydia if: ? You are sexually active and are younger than 48 years of age. ? You are older than 48 years of age and your health care provider tells you that you are at risk for this type of infection. ? Your sexual activity has changed since you were last screened and you are at an increased risk for chlamydia or gonorrhea. Ask your health care provider if you are at risk.  If you do not have HIV, but are at risk, it may be recommended  that you take a prescription medicine daily to prevent HIV infection. This is called pre-exposure prophylaxis (PrEP). You are considered at risk if: ? You are sexually active and do not regularly use condoms or know the HIV status of your partner(s). ? You take drugs by injection. ? You are sexually active with a partner who has HIV.  Talk with your health care provider about whether you are at high risk of being  infected with HIV. If you choose to begin PrEP, you should first be tested for HIV. You should then be tested every 3 months for as long as you are taking PrEP. Pregnancy  If you are premenopausal and you may become pregnant, ask your health care provider about preconception counseling.  If you may become pregnant, take 400 to 800 micrograms (mcg) of folic acid every day.  If you want to prevent pregnancy, talk to your health care provider about birth control (contraception). Osteoporosis and menopause  Osteoporosis is a disease in which the bones lose minerals and strength with aging. This can result in serious bone fractures. Your risk for osteoporosis can be identified using a bone density scan.  If you are 68 years of age or older, or if you are at risk for osteoporosis and fractures, ask your health care provider if you should be screened.  Ask your health care provider whether you should take a calcium or vitamin D supplement to lower your risk for osteoporosis.  Menopause may have certain physical symptoms and risks.  Hormone replacement therapy may reduce some of these symptoms and risks. Talk to your health care provider about whether hormone replacement therapy is right for you. Follow these instructions at home:  Schedule regular health, dental, and eye exams.  Stay current with your immunizations.  Do not use any tobacco products including cigarettes, chewing tobacco, or electronic cigarettes.  If you are pregnant, do not drink alcohol.  If you are  breastfeeding, limit how much and how often you drink alcohol.  Limit alcohol intake to no more than 1 drink per day for nonpregnant women. One drink equals 12 ounces of beer, 5 ounces of wine, or 1 ounces of hard liquor.  Do not use street drugs.  Do not share needles.  Ask your health care provider for help if you need support or information about quitting drugs.  Tell your health care provider if you often feel depressed.  Tell your health care provider if you have ever been abused or do not feel safe at home. This information is not intended to replace advice given to you by your health care provider. Make sure you discuss any questions you have with your health care provider. Document Released: 05/19/2011 Document Revised: 04/10/2016 Document Reviewed: 08/07/2015 Elsevier Interactive Patient Education  [employer].

## 2018-06-14 ENCOUNTER — Other Ambulatory Visit: Payer: BLUE CROSS/BLUE SHIELD

## 2018-06-14 NOTE — Addendum Note (Signed)
Addended by: Marchelle FolksMILLER, Barton Want G on: 06/14/2018 08:55 AM   Modules accepted: Orders

## 2018-06-15 LAB — TSH: TSH: 2.51 u[IU]/mL (ref 0.450–4.500)

## 2018-06-15 LAB — LIPID PANEL
Chol/HDL Ratio: 2.7 ratio (ref 0.0–4.4)
Cholesterol, Total: 223 mg/dL — ABNORMAL HIGH (ref 100–199)
HDL: 84 mg/dL (ref >39)
LDL Calculated: 128 mg/dL — ABNORMAL HIGH (ref 0–99)
Triglycerides: 56 mg/dL (ref 0–149)
VLDL Cholesterol Cal: 11 mg/dL (ref 5–40)

## 2018-06-15 LAB — HEMOGLOBIN A1C
ESTIMATED AVERAGE GLUCOSE: 114 mg/dL
HEMOGLOBIN A1C: 5.6 % (ref 4.8–5.6)

## 2018-06-15 LAB — GLUCOSE, RANDOM: Glucose: 74 mg/dL (ref 65–99)

## 2019-08-30 ENCOUNTER — Encounter: Payer: Self-pay | Admitting: Internal Medicine

## 2019-08-31 ENCOUNTER — Other Ambulatory Visit: Payer: Self-pay

## 2019-08-31 ENCOUNTER — Ambulatory Visit (INDEPENDENT_AMBULATORY_CARE_PROVIDER_SITE_OTHER): Payer: BLUE CROSS/BLUE SHIELD | Admitting: Internal Medicine

## 2019-08-31 ENCOUNTER — Encounter: Payer: Self-pay | Admitting: Internal Medicine

## 2019-08-31 VITALS — BP 128/92 | HR 83 | Ht 62.0 in | Wt 176.0 lb

## 2019-08-31 DIAGNOSIS — R030 Elevated blood-pressure reading, without diagnosis of hypertension: Secondary | ICD-10-CM | POA: Diagnosis not present

## 2019-08-31 DIAGNOSIS — Z1231 Encounter for screening mammogram for malignant neoplasm of breast: Secondary | ICD-10-CM

## 2019-08-31 DIAGNOSIS — M8580 Other specified disorders of bone density and structure, unspecified site: Secondary | ICD-10-CM | POA: Diagnosis not present

## 2019-08-31 DIAGNOSIS — N809 Endometriosis, unspecified: Secondary | ICD-10-CM

## 2019-08-31 DIAGNOSIS — I10 Essential (primary) hypertension: Secondary | ICD-10-CM | POA: Insufficient documentation

## 2019-08-31 DIAGNOSIS — I83891 Varicose veins of right lower extremities with other complications: Secondary | ICD-10-CM

## 2019-08-31 NOTE — Progress Notes (Signed)
Date:  08/31/2019   Name:  Andrea Massey   DOB:  Dec 10, 1970   MRN:  [id number]   Chief Complaint: Establish Care (Always concerned about weight and BP. Wants CPE later in the year.) She is transfering care from Dr. Greggory Keen. She has noticed BP slightly up at times - never over 150/90.  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 She is still having menses but they are irregular - often skips a month then returns.  Mild hot flashed but no sleep disruption or mood disorder. She has gained a few pounds and is working to control this. She has tenderness behind her right knee at times - concerned about blood clots.  Discomfort is relieved by compression stockings when she travels.  She has osteopenia and take calcium and vitamin D daily.  Last DEXA was in 2018. She is overdue for mammogram - last done 2018.  She denies breast issues.  HPI  Review of Systems  Constitutional: Negative for chills, diaphoresis, fatigue and unexpected weight change.  Respiratory: Negative for cough, chest tightness, shortness of breath and wheezing.   Cardiovascular: Positive for leg swelling (occasional varicose vein pain in right leg). Negative for chest pain and palpitations.  Gastrointestinal: Negative for abdominal pain, constipation and diarrhea.  Musculoskeletal: Negative for arthralgias, gait problem and joint swelling.  Neurological: Negative for dizziness and headaches.  Psychiatric/Behavioral: Negative for dysphoric mood and sleep disturbance. The patient is not nervous/anxious.     Patient Active Problem List   Diagnosis Date Noted  . Osteopenia 06/10/2018  . 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 Endometriosis 06/03/2016    Allergies  Allergen Reactions  . Amoxicillin Hives  . Penicillins Other (See Comments)    Unknown reaction    Past Surgical History:  Procedure Laterality Date  . CHOLECYSTECTOMY  11/2017  . HERNIA REPAIR    . KNEE SURGERY     . laparoscopy    . LEEP    . TUBAL LIGATION Bilateral 2009    Social History   Tobacco Use  . Smoking status: Never Smoker  . Smokeless tobacco: Never Used  Substance Use Topics  . Alcohol use: Yes    Comment: occas  . Drug use: No     Medication list has been reviewed and updated.  Current Meds  Medication Sig  . Calcium Carbonate-Vitamin D (CALCIUM 500 + D PO) Take by mouth. 600+D  . fluticasone (FLONASE) 50 MCG/ACT nasal spray Place into the nose.  . loratadine (ALLERGY) 10 MG tablet Take 10 mg by mouth daily.  . Multiple Vitamin (MULTIVITAMIN) capsule Take 1 capsule by mouth daily.    PHQ 2/9 Scores 08/31/2019  PHQ - 2 Score 0    BP Readings from Last 3 Encounters:  08/31/19 (!) 128/92  06/10/18 123/83  06/09/17 132/84    Physical Exam Vitals signs and nursing note reviewed.  Constitutional:      General: She is not in acute distress.    Appearance: Normal appearance. She is well-developed. She is not ill-appearing.  HENT:     Head: Normocephalic and atraumatic.  Eyes:     Extraocular Movements: Extraocular movements intact.     Pupils: Pupils are equal, round, and reactive to light.  Neck:     Musculoskeletal: Normal range of motion.     Vascular: No carotid bruit.  Cardiovascular:     Rate and Rhythm: Normal rate and regular rhythm.     Pulses:  Normal pulses.     Heart sounds: No murmur.  Pulmonary:     Effort: Pulmonary effort is normal. No respiratory distress.  Abdominal:     General: Abdomen is flat. There is no distension.     Palpations: Abdomen is soft.     Tenderness: There is no abdominal tenderness.     Hernia: No hernia is present.  Musculoskeletal: Normal range of motion.     Right lower leg: No edema.     Left lower leg: No edema.     Comments: Mild varicose vein tenderness just distal to the right knee posteriorly  Lymphadenopathy:     Cervical: No cervical adenopathy.  Skin:    General: Skin is warm and dry.     Capillary  Refill: Capillary refill takes less than 2 seconds.     Findings: No rash.  Neurological:     General: No focal deficit present.     Mental Status: She is alert and oriented to person, place, and time.  Psychiatric:        Attention and Perception: Attention normal.        Mood and Affect: Mood normal.        Behavior: Behavior normal.        Thought Content: Thought content normal.     Wt Readings from Last 3 Encounters:  08/31/19 176 lb (79.8 kg)  06/10/18 168 lb (76.2 kg)  06/09/17 169 lb 11.2 oz (77 kg)    BP (!) 128/92   Pulse 83   Ht 5\' 2"  (1.575 m)   Wt 176 lb (79.8 kg)   LMP 08/27/2019 (Exact Date)   SpO2 100%   BMI 32.19 kg/m   Assessment and Plan: 1. Elevated blood pressure reading Pt is given the DASH diet Encouraged to drink water, exercise regularly, control weight Monitor BP at home 1-2 times per month - return if persistently elevated  2. Encounter for screening mammogram for breast cancer Schedule at [place]; Future  3. Endometriosis No current sx - perimenopausal with mild hot flashes at times Last Pap 2017 neg with cotesting  4. Osteopenia, unspecified location - DG Bone Density; Future  5. Varicose veins of right leg with edema Pt reassured - continue compression stockings if needed Low risk for VTE   Partially dictated using Dragon software. Any errors are unintentional.  Halina Maidens, MD [facility]  08/31/2019

## 2019-08-31 NOTE — Patient Instructions (Addendum)
DASH Eating Plan DASH stands for "Dietary Approaches to Stop Hypertension." The DASH eating plan is a healthy eating plan that has been shown to reduce high blood pressure (hypertension). It may also reduce your risk for type 2 diabetes, heart disease, and stroke. The DASH eating plan may also help with weight loss.  Goal blood pressure 130/80 or less. What are tips for following this plan?  General guidelines  Avoid eating more than 2,300 mg (milligrams) of salt (sodium) a day. If you have hypertension, you may need to reduce your sodium intake to 1,500 mg a day.  Limit alcohol intake to no more than 1 drink a day for nonpregnant women and 2 drinks a day for men. One drink equals 12 oz of beer, 5 oz of wine, or 1 oz of hard liquor.  Work with your health care provider to maintain a healthy body weight or to lose weight. Ask what an ideal weight is for you.  Get at least 30 minutes of exercise that causes your heart to beat faster (aerobic exercise) most days of the week. Activities may include walking, swimming, or biking.  Work with your health care provider or diet and nutrition specialist (dietitian) to adjust your eating plan to your individual calorie needs. Reading food labels   Check food labels for the amount of sodium per serving. Choose foods with less than 5 percent of the Daily Value of sodium. Generally, foods with less than 300 mg of sodium per serving fit into this eating plan.  To find whole grains, look for the word "whole" as the first word in the ingredient list. Shopping  Buy products labeled as "low-sodium" or "no salt added."  Buy fresh foods. Avoid canned foods and premade or frozen meals. Cooking  Avoid adding salt when cooking. Use salt-free seasonings or herbs instead of table salt or sea salt. Check with your health care provider or pharmacist before using salt substitutes.  Do not fry foods. Cook foods using healthy methods such as baking, boiling,  grilling, and broiling instead.  Cook with heart-healthy oils, such as olive, canola, soybean, or sunflower oil. Meal planning  Eat a balanced diet that includes: ? 5 or more servings of fruits and vegetables each day. At each meal, try to fill half of your plate with fruits and vegetables. ? Up to 6-8 servings of whole grains each day. ? Less than 6 oz of lean meat, poultry, or fish each day. A 3-oz serving of meat is about the same size as a deck of cards. One egg equals 1 oz. ? 2 servings of low-fat dairy each day. ? A serving of nuts, seeds, or beans 5 times each week. ? Heart-healthy fats. Healthy fats called Omega-3 fatty acids are found in foods such as flaxseeds and coldwater fish, like sardines, salmon, and mackerel.  Limit how much you eat of the following: ? Canned or prepackaged foods. ? Food that is high in trans fat, such as fried foods. ? Food that is high in saturated fat, such as fatty meat. ? Sweets, desserts, sugary drinks, and other foods with added sugar. ? Full-fat dairy products.  Do not salt foods before eating.  Try to eat at least 2 vegetarian meals each week.  Eat more home-cooked food and less restaurant, buffet, and fast food.  When eating at a restaurant, ask that your food be prepared with less salt or no salt, if possible. What foods are recommended? The items listed may not be a  complete list. Talk with your dietitian about what dietary choices are best for you. Grains Whole-grain or whole-wheat bread. Whole-grain or whole-wheat pasta. Brown rice. [place]row. Bulgur. Whole-grain and low-sodium cereals. Pita bread. Low-fat, low-sodium crackers. Whole-wheat flour tortillas. Vegetables Fresh or frozen vegetables (raw, steamed, roasted, or grilled). Low-sodium or reduced-sodium tomato and vegetable juice. Low-sodium or reduced-sodium tomato sauce and tomato paste. Low-sodium or reduced-sodium canned vegetables. Fruits All fresh, dried, or frozen  fruit. Canned fruit in natural juice (without added sugar). Meat and other protein foods Skinless chicken or [place]. Ground chicken or [place]. Pork with fat trimmed off. Fish and seafood. Egg whites. Dried beans, peas, or lentils. Unsalted nuts, nut butters, and seeds. Unsalted canned beans. Lean cuts of beef with fat trimmed off. Low-sodium, lean deli meat. Dairy Low-fat (1%) or fat-free (skim) milk. Fat-free, low-fat, or reduced-fat cheeses. Nonfat, low-sodium ricotta or cottage cheese. Low-fat or nonfat yogurt. Low-fat, low-sodium cheese. Fats and oils Soft margarine without trans fats. Vegetable oil. Low-fat, reduced-fat, or light mayonnaise and salad dressings (reduced-sodium). Canola, safflower, olive, soybean, and sunflower oils. Avocado. Seasoning and other foods Herbs. Spices. Seasoning mixes without salt. Unsalted popcorn and pretzels. Fat-free sweets. What foods are not recommended? The items listed may not be a complete list. Talk with your dietitian about what dietary choices are best for you. Grains Baked goods made with fat, such as croissants, muffins, or some breads. Dry pasta or rice meal packs. Vegetables Creamed or fried vegetables. Vegetables in a cheese sauce. Regular canned vegetables (not low-sodium or reduced-sodium). Regular canned tomato sauce and paste (not low-sodium or reduced-sodium). Regular tomato and vegetable juice (not low-sodium or reduced-sodium). Angie Fava. Olives. Fruits Canned fruit in a light or heavy syrup. Fried fruit. Fruit in cream or butter sauce. Meat and other protein foods Fatty cuts of meat. Ribs. Fried meat. Berniece Salines. Sausage. Bologna and other processed lunch meats. Salami. Fatback. Hotdogs. Bratwurst. Salted nuts and seeds. Canned beans with added salt. Canned or smoked fish. Whole eggs or egg yolks. Chicken or [place] with skin. Dairy Whole or 2% milk, cream, and half-and-half. Whole or full-fat cream cheese. Whole-fat or sweetened yogurt. Full-fat  cheese. Nondairy creamers. Whipped toppings. Processed cheese and cheese spreads. Fats and oils Butter. Stick margarine. Lard. Shortening. Ghee. Bacon fat. Tropical oils, such as coconut, palm kernel, or palm oil. Seasoning and other foods Salted popcorn and pretzels. Onion salt, garlic salt, seasoned salt, table salt, and sea salt. Worcestershire sauce. Tartar sauce. Barbecue sauce. Teriyaki sauce. Soy sauce, including reduced-sodium. Steak sauce. Canned and packaged gravies. Fish sauce. Oyster sauce. Cocktail sauce. Horseradish that you find on the shelf. Ketchup. Mustard. Meat flavorings and tenderizers. Bouillon cubes. Hot sauce and Tabasco sauce. Premade or packaged marinades. Premade or packaged taco seasonings. Relishes. Regular salad dressings. Where to find more information:  National Heart, Lung, and [place]: https://wilson-eaton.com/  American Heart Association: www.heart.org Summary  The DASH eating plan is a healthy eating plan that has been shown to reduce high blood pressure (hypertension). It may also reduce your risk for type 2 diabetes, heart disease, and stroke.  With the DASH eating plan, you should limit salt (sodium) intake to 2,300 mg a day. If you have hypertension, you may need to reduce your sodium intake to 1,500 mg a day.  When on the DASH eating plan, aim to eat more fresh fruits and vegetables, whole grains, lean proteins, low-fat dairy, and heart-healthy fats.  Work with your health care provider or diet and nutrition specialist (dietitian)  to adjust your eating plan to your individual calorie needs. This information is not intended to replace advice given to you by your health care provider. Make sure you discuss any questions you have with your health care provider. Document Released: 10/23/2011 Document Revised: 10/16/2017 Document Reviewed: 10/27/2016 Elsevier Patient Education  2020 [employer].

## 2020-01-03 ENCOUNTER — Inpatient Hospital Stay: Admission: RE | Admit: 2020-01-03 | Payer: Self-pay | Source: Ambulatory Visit

## 2020-01-03 ENCOUNTER — Ambulatory Visit: Payer: Self-pay

## 2020-01-04 ENCOUNTER — Other Ambulatory Visit: Payer: Self-pay

## 2020-01-04 ENCOUNTER — Ambulatory Visit: Payer: Self-pay | Attending: Obstetrics and Gynecology

## 2020-01-04 ENCOUNTER — Ambulatory Visit
Admission: RE | Admit: 2020-01-04 | Discharge: 2020-01-04 | Disposition: A | Discharge status: Home / Self Care | Payer: Self-pay | Source: Ambulatory Visit | Attending: Oncology | Admitting: Oncology

## 2020-01-04 VITALS — Ht 63.0 in | Wt 176.6 lb

## 2020-01-04 DIAGNOSIS — Z0000 Encounter for general adult medical examination without abnormal findings: Secondary | ICD-10-CM

## 2020-01-04 NOTE — Progress Notes (Signed)
Patient ID: Andrea Massey, female   DOB: [birth year], 49 y.o.   MRN: [id number]

## 2020-01-04 NOTE — Progress Notes (Signed)
  Subjective:     Patient ID: [name], female   DOB: February 01, 1971, 49 y.o.   MRN: [id number]  HPI   Review of Systems     Objective:   Physical Exam Chest:     Breasts:        Right: No swelling, bleeding, inverted nipple, mass, nipple discharge, skin change or tenderness.        Left: Inverted nipple present. No swelling, bleeding, mass, nipple discharge, skin change or tenderness.        Assessment:     49 year old patient presents for [facility] clinic visit.  Patient screened, and meets BCCCP eligibility.  Patient does not have insurance, Medicare or Medicaid. Instructed patient on breast self awareness using teach back method. Clinical breast exam unremarkable. Recheck blood pressure 148/91.  Patient to monitor for one week and report to primary provider if remains elevated.  She has multiple stressors, including inclement weather prediction.  Risk Assessment    Risk Scores      01/04/2020   Last edited by: Scarlett Presto, RN   5-year risk: 1.1 %   Lifetime risk: 8.8 %             Plan:     Sent for bilateral screening mammogram.

## 2020-01-06 ENCOUNTER — Ambulatory Visit (INDEPENDENT_AMBULATORY_CARE_PROVIDER_SITE_OTHER): Payer: Self-pay | Admitting: Internal Medicine

## 2020-01-06 ENCOUNTER — Other Ambulatory Visit: Payer: Self-pay

## 2020-01-06 ENCOUNTER — Encounter: Payer: Self-pay | Admitting: Internal Medicine

## 2020-01-06 VITALS — BP 152/102 | HR 92 | Ht 63.0 in | Wt 175.0 lb

## 2020-01-06 DIAGNOSIS — I10 Essential (primary) hypertension: Secondary | ICD-10-CM

## 2020-01-06 MED ORDER — IRBESARTAN 150 MG PO TABS: 150.0000 mg | ORAL_TABLET | Freq: Every day | ORAL | 1 refills | Status: DC

## 2020-01-06 MED ORDER — IRBESARTAN 150 MG PO TABS: 300.0000 mg | ORAL_TABLET | Freq: Every day | ORAL | 1 refills | Status: DC

## 2020-01-06 NOTE — Progress Notes (Signed)
Date:  01/06/2020   Name:  Andrea Massey   DOB:  [birth year]   MRN:  [id number]   Chief Complaint: Hypertension (Always up and down. Past 3 years feels like its staying up. )  Hypertension This is a new problem. The problem has been gradually worsening since onset. The problem is uncontrolled. Pertinent negatives include no chest pain, headaches, palpitations, peripheral edema or shortness of breath. There are no associated agents to hypertension. There are no known risk factors for coronary artery disease. Past treatments include nothing. There are no compliance problems.     Lab Results  Component Value Date   CREATININE 0.72 09/27/2012   BUN 18 09/27/2012   NA 138 09/27/2012   K 3.8 09/27/2012   CL 105 09/27/2012   CO2 28 09/27/2012   Lab Results  Component Value Date   CHOL 223 (H) 06/14/2018   HDL 84 06/14/2018   LDLCALC 128 (H) 06/14/2018   TRIG 56 06/14/2018   CHOLHDL 2.7 06/14/2018   Lab Results  Component Value Date   TSH 2.510 06/14/2018   Lab Results  Component Value Date   HGBA1C 5.6 06/14/2018     Review of Systems  Constitutional: Negative for chills, fatigue and fever.  HENT: Negative for trouble swallowing.   Respiratory: Negative for cough, chest tightness and shortness of breath.   Cardiovascular: Negative for chest pain, palpitations and leg swelling.  Neurological: Negative for dizziness, light-headedness and headaches.  Psychiatric/Behavioral: Negative for dysphoric mood and sleep disturbance. The patient is nervous/anxious (worried about her BP).     Patient Active Problem List   Diagnosis Date Noted  . Elevated blood pressure reading 08/31/2019  . Varicose veins of right leg with edema 08/31/2019  . Osteopenia 06/10/2018  . 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 Endometriosis 06/03/2016    Allergies  Allergen Reactions  . Amoxicillin Hives  . Penicillins Other (See Comments)    Unknown reaction    Past Surgical History:  Procedure  Laterality Date  . CHOLECYSTECTOMY  11/2017  . HERNIA REPAIR    . KNEE SURGERY    . laparoscopy    . LEEP    . TUBAL LIGATION Bilateral 2009    Social History   Tobacco Use  . Smoking status: Never Smoker  . Smokeless tobacco: Never Used  Substance Use Topics  . Alcohol use: Yes    Comment: occas  . Drug use: No     Medication list has been reviewed and updated.  Current Meds  Medication Sig  . Calcium Carbonate-Vitamin D (CALCIUM 500 + D PO) Take by mouth. 600+D  . fluticasone (FLONASE) 50 MCG/ACT nasal spray Place into the nose.  . loratadine (ALLERGY) 10 MG tablet Take 10 mg by mouth daily.  . Multiple Vitamin (MULTIVITAMIN) capsule Take 1 capsule by mouth daily.    PHQ 2/9 Scores 01/06/2020 08/31/2019  PHQ - 2 Score 0 0    BP Readings from Last 3 Encounters:  01/06/20 (!) 152/102  08/31/19 (!) 128/92  06/10/18 123/83    Physical Exam Vitals and nursing note reviewed.  Constitutional:      General: She is not in acute distress.    Appearance: Normal appearance. She is well-developed.  HENT:     Head: Normocephalic and atraumatic.  Neck:     Vascular: No carotid bruit.  Cardiovascular:     Rate and Rhythm: Normal rate and regular rhythm.     Pulses: Normal pulses.     Heart  sounds: No murmur.  Pulmonary:     Effort: Pulmonary effort is normal. No respiratory distress.     Breath sounds: No wheezing or rhonchi.  Musculoskeletal:        General: Normal range of motion.     Cervical back: Normal range of motion.     Right lower leg: No edema.     Left lower leg: No edema.  Lymphadenopathy:     Cervical: No cervical adenopathy.  Skin:    General: Skin is warm and dry.     Capillary Refill: Capillary refill takes less than 2 seconds.     Findings: No rash.  Neurological:     General: No focal deficit present.     Mental Status: She is alert and oriented to person, place, and time.  Psychiatric:        Behavior: Behavior normal.        Thought  Content: Thought content normal.     Wt Readings from Last 3 Encounters:  01/06/20 175 lb (79.4 kg)  01/04/20 176 lb 9.6 oz (80.1 kg)  08/31/19 176 lb (79.8 kg)    BP (!) 152/102 Comment: 166/108 on patients automatic cuff.  Pulse 92   Ht 5\' 3"  (1.6 m)   Wt 175 lb (79.4 kg)   LMP 11/23/2019 (Exact Date)   SpO2 99%   BMI 31.00 kg/m   Assessment and Plan: 1. Essential hypertension BP is uncontrolled on no medication Home cuff correlates well Begin Avapro 150 mg per day.  Continue healthy diet, low sodium Follow up if problems and in 6 weeks for recheck - irbesartan (AVAPRO) 150 MG tablet; Take 1 tablet (150 mg total) by mouth daily.  Dispense: 30 tablet; Refill: 1   Partially dictated using 01/21/2020. Any errors are unintentional.  Animal nutritionist, MD [facility]  01/06/2020

## 2020-01-06 NOTE — Patient Instructions (Signed)
Do not measure your bp for several weeks.  Give the medication a chance to reach steady state.  Then you may, if you wish, being checking bp intermittently.

## 2020-01-09 ENCOUNTER — Encounter: Payer: Self-pay | Admitting: Internal Medicine

## 2020-01-13 NOTE — Progress Notes (Signed)
Letter mailed from [facility] to notify of normal mammogram results.  Patient to return in one year for annual screening.  Copy to [facility]. 

## 2020-02-20 ENCOUNTER — Ambulatory Visit: Payer: Self-pay | Admitting: Internal Medicine

## 2020-03-05 ENCOUNTER — Other Ambulatory Visit: Payer: Self-pay | Admitting: Internal Medicine

## 2020-03-05 DIAGNOSIS — I10 Essential (primary) hypertension: Secondary | ICD-10-CM

## 2020-03-27 ENCOUNTER — Other Ambulatory Visit: Payer: Self-pay

## 2020-03-27 ENCOUNTER — Encounter: Payer: Self-pay | Admitting: Internal Medicine

## 2020-03-27 ENCOUNTER — Ambulatory Visit (INDEPENDENT_AMBULATORY_CARE_PROVIDER_SITE_OTHER): Payer: Self-pay | Admitting: Internal Medicine

## 2020-03-27 VITALS — BP 158/90 | HR 81 | Temp 97.0°F | Ht 63.0 in | Wt 181.0 lb

## 2020-03-27 DIAGNOSIS — I10 Essential (primary) hypertension: Secondary | ICD-10-CM

## 2020-03-27 MED ORDER — IRBESARTAN 150 MG PO TABS: 150.0000 mg | ORAL_TABLET | Freq: Every day | ORAL | 1 refills | Status: DC

## 2020-03-27 NOTE — Progress Notes (Signed)
Date:  03/27/2020   Name:  Andrea Massey   DOB:  January 03, 1971   MRN:  [id number]   Chief Complaint: Hypertension (6 month follow up. Pt said she ran out of irbesartan 150 mg about 2-3 weeks ago. )  Hypertension This is a new problem. The current episode started more than 1 month ago. The problem has been gradually improving since onset. Pertinent negatives include no chest pain, headaches or shortness of breath. Past treatments include angiotensin blockers and lifestyle changes (irbesartan started last visit). There are no compliance problems (but ran out of refills several weeks ago).     Lab Results  Component Value Date   CREATININE 0.72 09/27/2012   BUN 18 09/27/2012   NA 138 09/27/2012   K 3.8 09/27/2012   CL 105 09/27/2012   CO2 28 09/27/2012   Lab Results  Component Value Date   CHOL 223 (H) 06/14/2018   HDL 84 06/14/2018   LDLCALC 128 (H) 06/14/2018   TRIG 56 06/14/2018   CHOLHDL 2.7 06/14/2018   Lab Results  Component Value Date   TSH 2.510 06/14/2018   Lab Results  Component Value Date   HGBA1C 5.6 06/14/2018   Lab Results  Component Value Date   WBC 9.2 09/27/2012   HGB 11.6 (L) 09/27/2012   HCT 35.8 09/27/2012   MCV 86 09/27/2012   PLT 285 09/27/2012   No results found for: ALT, AST, GGT, ALKPHOS, BILITOT   Review of Systems  Constitutional: Negative for chills, fatigue and fever.  Respiratory: Negative for cough, chest tightness and shortness of breath.   Cardiovascular: Negative for chest pain and leg swelling.  Neurological: Negative for dizziness and headaches.    Patient Active Problem List   Diagnosis Date Noted  . Essential hypertension 08/31/2019  . Varicose veins of right leg with edema 08/31/2019  . Osteopenia 06/10/2018  . 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 Endometriosis 06/03/2016    Allergies  Allergen Reactions  . Amoxicillin Hives  . Penicillins Other (See Comments)    Unknown reaction    Past Surgical History:    Procedure Laterality Date  . CHOLECYSTECTOMY  11/2017  . HERNIA REPAIR    . KNEE SURGERY    . laparoscopy    . LEEP    . TUBAL LIGATION Bilateral 2009    Social History   Tobacco Use  . Smoking status: Never Smoker  . Smokeless tobacco: Never Used  Substance Use Topics  . Alcohol use: Yes    Comment: occas  . Drug use: No     Medication list has been reviewed and updated.  Current Meds  Medication Sig  . Calcium Carbonate-Vitamin D (CALCIUM 500 + D PO) Take by mouth. 600+D  . fluticasone (FLONASE) 50 MCG/ACT nasal spray Place into the nose.  . irbesartan (AVAPRO) 150 MG tablet Take 1 tablet (150 mg total) by mouth daily.  Marland Kitchen loratadine (ALLERGY) 10 MG tablet Take 10 mg by mouth daily.  . Multiple Vitamin (MULTIVITAMIN) capsule Take 1 capsule by mouth daily.  . [DISCONTINUED] irbesartan (AVAPRO) 150 MG tablet TAKE 1 TABLET(150 MG) BY MOUTH DAILY    PHQ 2/9 Scores 03/27/2020 01/06/2020 08/31/2019  PHQ - 2 Score 0 0 0  PHQ- 9 Score 0 - -    BP Readings from Last 3 Encounters:  03/27/20 (!) 158/90  01/06/20 (!) 152/102  08/31/19 (!) 128/92    Physical Exam Vitals and nursing note reviewed.  Constitutional:  General: She is not in acute distress.    Appearance: Normal appearance. She is well-developed.  HENT:     Head: Normocephalic and atraumatic.  Neck:     Vascular: No carotid bruit.  Cardiovascular:     Rate and Rhythm: Normal rate and regular rhythm.     Pulses: Normal pulses.     Heart sounds: No murmur.  Pulmonary:     Effort: Pulmonary effort is normal. No respiratory distress.     Breath sounds: No wheezing or rhonchi.  Musculoskeletal:     Cervical back: Normal range of motion.     Right lower leg: No edema.     Left lower leg: No edema.  Lymphadenopathy:     Cervical: No cervical adenopathy.  Skin:    General: Skin is warm and dry.     Capillary Refill: Capillary refill takes less than 2 seconds.     Findings: No rash.  Neurological:      General: No focal deficit present.     Mental Status: She is alert and oriented to person, place, and time.  Psychiatric:        Behavior: Behavior normal.        Thought Content: Thought content normal.     Wt Readings from Last 3 Encounters:  03/27/20 181 lb (82.1 kg)  01/06/20 175 lb (79.4 kg)  01/04/20 176 lb 9.6 oz (80.1 kg)    BP (!) 158/90   Pulse 81   Temp (!) 97 F (36.1 C) (Temporal)   Ht 5\' 3"  (1.6 m)   Wt 181 lb (82.1 kg)   SpO2 99%   BMI 32.06 kg/m   Assessment and Plan: 1. Essential hypertension Some improvement with lifestyle changes Resume Avapro 150 mg once a day Follow up in 3 months - irbesartan (AVAPRO) 150 MG tablet; Take 1 tablet (150 mg total) by mouth daily.  Dispense: 90 tablet; Refill: 1 - Comprehensive metabolic panel - CBC with Differential/Platelet - TSH   Partially dictated using . Any errors are unintentional.  Animal nutritionist, MD [facility]  03/27/2020

## 2020-03-28 ENCOUNTER — Other Ambulatory Visit
Admission: RE | Admit: 2020-03-28 | Discharge: 2020-03-28 | Disposition: A | Discharge status: Home / Self Care | Payer: Self-pay | Attending: Internal Medicine | Admitting: Internal Medicine

## 2020-03-28 DIAGNOSIS — I10 Essential (primary) hypertension: Secondary | ICD-10-CM | POA: Insufficient documentation

## 2020-03-28 LAB — CBC WITH DIFFERENTIAL/PLATELET
Abs Immature Granulocytes: 0.02 10*3/uL (ref 0.00–0.07)
Basophils Absolute: 0.1 10*3/uL (ref 0.0–0.1)
Basophils Relative: 1 %
Eosinophils Absolute: 0.1 10*3/uL (ref 0.0–0.5)
Eosinophils Relative: 1 %
HCT: 37.5 % (ref 36.0–46.0)
Hemoglobin: 11.9 g/dL — ABNORMAL LOW (ref 12.0–15.0)
Immature Granulocytes: 0 %
Lymphocytes Relative: 28 %
Lymphs Abs: 3 10*3/uL (ref 0.7–4.0)
MCH: 27.3 pg (ref 26.0–34.0)
MCHC: 31.7 g/dL (ref 30.0–36.0)
MCV: 86 fL (ref 80.0–100.0)
Monocytes Absolute: 0.6 10*3/uL (ref 0.1–1.0)
Monocytes Relative: 6 %
Neutro Abs: 6.7 10*3/uL (ref 1.7–7.7)
Neutrophils Relative %: 64 %
Platelets: 358 10*3/uL (ref 150–400)
RBC: 4.36 MIL/uL (ref 3.87–5.11)
RDW: 15.7 % — ABNORMAL HIGH (ref 11.5–15.5)
WBC: 10.5 10*3/uL (ref 4.0–10.5)
nRBC: 0 % (ref 0.0–0.2)

## 2020-03-28 LAB — COMPREHENSIVE METABOLIC PANEL
ALT: 13 U/L (ref 0–44)
AST: 23 U/L (ref 15–41)
Albumin: 4.2 g/dL (ref 3.5–5.0)
Alkaline Phosphatase: 75 U/L (ref 38–126)
Anion gap: 9 (ref 5–15)
BUN: 13 mg/dL (ref 6–20)
CO2: 28 mmol/L (ref 22–32)
Calcium: 9 mg/dL (ref 8.9–10.3)
Chloride: 101 mmol/L (ref 98–111)
Creatinine, Ser: 0.71 mg/dL (ref 0.44–1.00)
GFR calc Af Amer: >60 mL/min (ref >60)
GFR calc non Af Amer: >60 mL/min (ref >60)
Glucose, Bld: 89 mg/dL (ref 70–99)
Potassium: 4 mmol/L (ref 3.5–5.1)
Sodium: 138 mmol/L (ref 135–145)
Total Bilirubin: 0.6 mg/dL (ref 0.3–1.2)
Total Protein: 8 g/dL (ref 6.5–8.1)

## 2020-03-28 LAB — TSH: TSH: 2.23 u[IU]/mL (ref 0.350–4.500)

## 2020-05-12 ENCOUNTER — Other Ambulatory Visit: Payer: Self-pay

## 2020-05-12 ENCOUNTER — Encounter: Payer: Self-pay | Admitting: Internal Medicine

## 2020-05-12 ENCOUNTER — Ambulatory Visit
Admission: EM | Admit: 2020-05-12 | Discharge: 2020-05-12 | Disposition: A | Discharge status: Home / Self Care | Payer: Self-pay | Attending: Emergency Medicine | Admitting: Emergency Medicine

## 2020-05-12 DIAGNOSIS — F419 Anxiety disorder, unspecified: Secondary | ICD-10-CM

## 2020-05-12 HISTORY — DX: Essential (primary) hypertension: I10

## 2020-05-12 MED ORDER — HYDROXYZINE HCL 25 MG PO TABS: 25.0000 mg | ORAL_TABLET | Freq: Every evening | ORAL | 1 refills | Status: DC | PRN

## 2020-05-12 NOTE — ED Triage Notes (Signed)
Pt is very upset and stressed over her brother's cancer getting worse. Worried her blood pressure is elevated and wants something to help her anxiety.

## 2020-05-12 NOTE — Discharge Instructions (Signed)
You were seen for high blood pressure and are being treated for anxiety.   Follow-up with your primary care provider to talk about possibly starting on some antianxiety/antidepressant medication. Find a therapist in the area to help during this time. Check your blood pressure at home. Try over-the-counter melatonin and sleep hygiene to help with insomnia.  Take care, Dr. Sharlet Salina, NP-c

## 2020-05-12 NOTE — ED Provider Notes (Signed)
[facility] - [place], [place]   Name: Andrea Massey DOB: [birth year] MRN: [id number] CSN: [id number] PCP: Glean Hess, MD  Arrival date and time:  05/12/20 1511  Chief Complaint:  Anxiety   NOTE: Prior to seeing the patient today, I have reviewed the triage nursing documentation and vital signs. Clinical staff has updated patient's PMH/PSHx, current medication list, and drug allergies/intolerances to ensure comprehensive history available to assist in medical decision making.   History:   HPI: Andrea Massey is a 49 y.o. female who presents today with complaints of anxiety and possible high blood pressure.  Patient has a history of high blood pressure and is currently getting treated with oral antihypertensives.  However, patient received some challenging news regarding her brother and she feels her blood pressure is increasing due to her anxiety.  She has a blood pressure cuff at home, but she has not checked her blood pressure.  She states her anxiety is causing some chest pressure and shortness of breath and an inability to fall asleep.  She denies any blurred vision or headaches.  He has no previous diagnosis of anxiety.   Past Medical History:  Diagnosis Date  . Breast cyst    left  . Endometriosis   . HSV infection   . Hypertension   . Osteoporosis   . Vaginal Pap smear, abnormal    dysplasia of cervix    Past Surgical History:  Procedure Laterality Date  . CHOLECYSTECTOMY  11/2017  . HERNIA REPAIR    . KNEE SURGERY    . laparoscopy    . LEEP    . TUBAL LIGATION Bilateral 2009    Family History  Problem Relation Age of Onset  . Cerebral aneurysm Mother   . Lung cancer Father   . Heart disease Paternal Aunt   . Breast cancer Other        paternal great-aunt  . Lung cancer Brother   . Cancer Neg Hx   . Diabetes Neg Hx     Social History   Tobacco Use  . Smoking status: Never Smoker  . Smokeless tobacco: Never Used  Vaping  Use  . Vaping Use: Never used  Substance Use Topics  . Alcohol use: Yes    Comment: occas  . Drug use: No    Patient Active Problem List   Diagnosis Date Noted  . Essential hypertension 08/31/2019  . Varicose veins of right leg with edema 08/31/2019  . Osteopenia 06/10/2018  . 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 Endometriosis 06/03/2016    Home Medications:    No outpatient medications have been marked as taking for the 05/12/20 encounter [facility] Encounter).    Allergies:   Amoxicillin and Penicillins  Review of Systems (ROS): Review of Systems  Constitutional: Negative for activity change.  Respiratory: Negative for shortness of breath.   Cardiovascular: Negative for chest pain, palpitations and leg swelling.  Neurological: Negative for dizziness and headaches.  Psychiatric/Behavioral: Positive for sleep disturbance. Negative for hallucinations. The patient is nervous/anxious.      Vital Signs: Today's Vitals   05/12/20 1528 05/12/20 1531 05/12/20 1554 05/12/20 1606  BP:  (!) 162/100 (!) 144/95   Pulse:  90    Resp:  16    Temp:  98.1 F (36.7 C)    TempSrc:  Oral    SpO2:  100%    Weight: 172 lb (78 kg)     Height: 5\' 2"  (1.575 m)  PainSc:    5     Physical Exam: Physical Exam Vitals and nursing note reviewed.  Constitutional:      General: She is not in acute distress.    Appearance: She is well-developed.  HENT:     Head: Normocephalic and atraumatic.  Eyes:     Conjunctiva/sclera: Conjunctivae normal.  Cardiovascular:     Rate and Rhythm: Normal rate and regular rhythm.     Heart sounds: No murmur heard.   Pulmonary:     Effort: Pulmonary effort is normal. No respiratory distress.     Breath sounds: Normal breath sounds.  Abdominal:     Palpations: Abdomen is soft.     Tenderness: There is no abdominal tenderness.  Musculoskeletal:     Cervical back: Neck supple.  Skin:    General: Skin is warm and dry.  Neurological:     Mental Status: She is  alert.      Urgent Care Treatments / Results:   LABS: PLEASE NOTE: all labs that were ordered this encounter are listed, however only abnormal results are displayed. Labs Reviewed - No data to display  EKG: -None  RADIOLOGY: No results found.  PROCEDURES: Procedures  MEDICATIONS RECEIVED THIS VISIT: Medications - No data to display  PERTINENT CLINICAL COURSE NOTES/UPDATES:   Initial Impression / Assessment and Plan / Urgent Care Course:  Pertinent labs & imaging results that were available during my care of the patient were personally reviewed by me and considered in my medical decision making (see lab/imaging section of note for values and interpretations).  Elleigh Cassetta Montini is a 49 y.o. female who presents to [facility] today with complaints of anxiety, diagnosed with the same, and treated as such with the medications below. NP and patient reviewed discharge instructions below during visit.   Patient is well appearing overall in clinic today. She does not appear to be in any acute distress. Presenting symptoms (see HPI) and exam as documented above.   I have reviewed the follow up and strict return precautions for any new or worsening symptoms. Patient is aware of symptoms that would be deemed urgent/emergent, and would thus require further evaluation either here or in the emergency department. At the time of discharge, she verbalized understanding and consent with the discharge plan as it was reviewed with her. All questions were fielded by provider and/or clinic staff prior to patient discharge.    Final Clinical Impressions / Urgent Care Diagnoses:   Final diagnoses:  Anxiety    New Prescriptions:  [place] Controlled Substance Registry consulted? Not Applicable  Meds ordered this encounter  Medications  . hydrOXYzine (ATARAX/VISTARIL) 25 MG tablet    Sig: Take 1 tablet (25 mg total) by mouth at bedtime as needed for anxiety.    Dispense:  12 tablet     Refill:  1      Discharge Instructions     You were seen for high blood pressure and are being treated for anxiety.   . Follow-up with your primary care provider to talk about possibly starting on some antianxiety/antidepressant medication. . Find a therapist in the area to help during this time. . Check your blood pressure at home. . Try over-the-counter melatonin and sleep hygiene to help with insomnia.  Take care, Dr. Sharlet Salina, NP-c     Recommended Follow up Care:  Patient encouraged to follow up with the following provider within the specified time frame, or sooner as dictated by the severity of her symptoms.  As always, she was instructed that for any urgent/emergent care needs, she should seek care either here or in the emergency department for more immediate evaluation.   Bailey Mech, DNP, NP-c    Bailey Mech, NP 05/12/20 [phone]

## 2020-05-14 ENCOUNTER — Telehealth: Payer: Self-pay

## 2020-05-14 ENCOUNTER — Other Ambulatory Visit: Payer: Self-pay

## 2020-05-14 ENCOUNTER — Encounter: Payer: Self-pay | Admitting: Internal Medicine

## 2020-05-14 ENCOUNTER — Ambulatory Visit (INDEPENDENT_AMBULATORY_CARE_PROVIDER_SITE_OTHER): Payer: Self-pay | Admitting: Internal Medicine

## 2020-05-14 VITALS — BP 151/99 | Ht 62.0 in | Wt 172.0 lb

## 2020-05-14 DIAGNOSIS — F419 Anxiety disorder, unspecified: Secondary | ICD-10-CM | POA: Insufficient documentation

## 2020-05-14 DIAGNOSIS — I10 Essential (primary) hypertension: Secondary | ICD-10-CM

## 2020-05-14 NOTE — Progress Notes (Signed)
Date:  05/14/2020   Name:  Andrea Massey   DOB:  1971/05/03   MRN:  [id number]  I connected with this patient, Andrea Massey, by telephone at the patient's home..  I verified that I am speaking with the correct person using two identifiers. This visit was conducted via telephone due to the Covid-19 outbreak from my office at [facility] in [place], [place]. I discussed the limitations, risks, security and privacy concerns of performing an evaluation and management service by telephone. I also discussed with the patient that there may be a patient responsible charge related to this service. The patient expressed understanding and agreed to proceed.  Chief Complaint: Anxiety (GAD7- 10, PQH9- 5)  Anxiety Presents for initial visit. The problem has been gradually improving. Symptoms include excessive worry, insomnia, nervous/anxious behavior and restlessness. Patient reports no chest pain, decreased concentration, dizziness or shortness of breath. Symptoms occur most days.   Risk factors: terminal illness in her brother. Treatments tried: just started Melatonin and Hydroxyzine from UC. The treatment provided mild relief.  Hypertension This is a chronic problem. The problem is resistant. Associated symptoms include anxiety. Pertinent negatives include no chest pain, headaches or shortness of breath. Past treatments include angiotensin blockers. The current treatment provides moderate improvement.    Lab Results  Component Value Date   CREATININE 0.71 03/28/2020   BUN 13 03/28/2020   NA 138 03/28/2020   K 4.0 03/28/2020   CL 101 03/28/2020   CO2 28 03/28/2020   Lab Results  Component Value Date   CHOL 223 (H) 06/14/2018   HDL 84 06/14/2018   LDLCALC 128 (H) 06/14/2018   TRIG 56 06/14/2018   CHOLHDL 2.7 06/14/2018   Lab Results  Component Value Date   TSH 2.230 03/28/2020   Lab Results  Component Value Date   HGBA1C 5.6 06/14/2018   Lab Results  Component Value Date    WBC 10.5 03/28/2020   HGB 11.9 (L) 03/28/2020   HCT 37.5 03/28/2020   MCV 86.0 03/28/2020   PLT 358 03/28/2020   Lab Results  Component Value Date   ALT 13 03/28/2020   AST 23 03/28/2020   ALKPHOS 75 03/28/2020   BILITOT 0.6 03/28/2020     Review of Systems  Constitutional: Negative for appetite change, fatigue and fever.  Respiratory: Negative for chest tightness and shortness of breath.   Cardiovascular: Negative for chest pain.  Neurological: Negative for dizziness and headaches.  Psychiatric/Behavioral: Positive for sleep disturbance. Negative for agitation and decreased concentration. The patient is nervous/anxious and has insomnia.     Patient Active Problem List   Diagnosis Date Noted  . Essential hypertension 08/31/2019  . Varicose veins of right leg with edema 08/31/2019  . Osteopenia 06/10/2018  . 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 Endometriosis 06/03/2016    Allergies  Allergen Reactions  . Amoxicillin Hives  . Penicillins Other (See Comments)    Unknown reaction    Past Surgical History:  Procedure Laterality Date  . CHOLECYSTECTOMY  11/2017  . HERNIA REPAIR    . KNEE SURGERY    . laparoscopy    . LEEP    . TUBAL LIGATION Bilateral 2009    Social History   Tobacco Use  . Smoking status: Never Smoker  . Smokeless tobacco: Never Used  Vaping Use  . Vaping Use: Never used  Substance Use Topics  . Alcohol use: Yes    Comment: occas  . Drug use: No  Medication list has been reviewed and updated.  Current Meds  Medication Sig  . Calcium Carbonate-Vitamin D (CALCIUM 500 + D PO) Take by mouth. 600+D  . fluticasone (FLONASE) 50 MCG/ACT nasal spray Place into the nose.  . hydrOXYzine (ATARAX/VISTARIL) 25 MG tablet Take 1 tablet (25 mg total) by mouth at bedtime as needed for anxiety.  . irbesartan (AVAPRO) 150 MG tablet Take 1 tablet (150 mg total) by mouth daily.  Marland Kitchen loratadine (ALLERGY) 10 MG tablet Take 10 mg by mouth daily.  . Multiple Vitamin  (MULTIVITAMIN) capsule Take 1 capsule by mouth daily.    PHQ 2/9 Scores 05/14/2020 03/27/2020 01/06/2020 08/31/2019  PHQ - 2 Score 3 0 0 0  PHQ- 9 Score 5 0 - -    GAD 7 : Generalized Anxiety Score 05/14/2020 03/27/2020  Nervous, Anxious, on Edge 3 1  Control/stop worrying 3 1  Worry too much - different things 0 1  Trouble relaxing 0 0  Restless 0 0  Easily annoyed or irritable 3 0  Afraid - awful might happen 1 0  Total GAD 7 Score 10 3  Anxiety Difficulty Somewhat difficult Not difficult at all    BP Readings from Last 3 Encounters:  05/14/20 (!) 151/99  05/12/20 (!) 144/95  03/27/20 (!) 158/90    Physical Exam Neurological:     Mental Status: She is alert.  Psychiatric:        Attention and Perception: Attention normal.        Mood and Affect: Mood normal.        Speech: Speech normal.        Thought Content: Thought content does not include suicidal plan.        Cognition and Memory: Cognition normal.     Wt Readings from Last 3 Encounters:  05/14/20 172 lb (78 kg)  05/12/20 172 lb (78 kg)  03/27/20 181 lb (82.1 kg)    BP (!) 151/99   Ht 5\' 2"  (1.575 m)   Wt 172 lb (78 kg)   BMI 31.46 kg/m   Assessment and Plan: 1. Anxiety disorder, unspecified type Recommend continuing Melatonin at bedtime to aid with sleep Use Hydroxyzine prn - caution for sedation; suggest taking it in the evening to help her relax before bedtime If symptoms are persistent and brother's terminal status is prolonged, consider SSRI therapy  2. Essential hypertension BP not controlled but likely situational Continue Avapro 150 mg daily Monitor BP and if still high will increase avapro to 300 mg Pt will send message via Mychart.  I spent 12 minutes on this encounter. Partially dictated using . Any errors are unintentional.  Animal nutritionist, MD [facility]  05/14/2020

## 2020-05-14 NOTE — Telephone Encounter (Signed)
This visit type is being conducted due to national recommendations for restrictions regarding the COVID- 19 Pandemic (e.g. social distancing) in effort to limit this patients exposure and mitigate transmission in our community. This visit type is felt to be most appropriate for this patient at this time.   I connected with the patient today and received telephone consent from the patient and patient understand this consent will be good for 1 year.  CM 

## 2020-05-20 ENCOUNTER — Encounter: Payer: Self-pay | Admitting: Internal Medicine

## 2020-05-21 ENCOUNTER — Other Ambulatory Visit: Payer: Self-pay | Admitting: Internal Medicine

## 2020-05-21 MED ORDER — HYDROXYZINE HCL 25 MG PO TABS: 25.0000 mg | ORAL_TABLET | Freq: Every evening | ORAL | 0 refills | Status: DC | PRN

## 2020-05-31 ENCOUNTER — Encounter: Payer: Self-pay | Admitting: Internal Medicine

## 2020-05-31 NOTE — Telephone Encounter (Signed)
Pts response.  KP

## 2020-06-01 ENCOUNTER — Other Ambulatory Visit: Payer: Self-pay | Admitting: Internal Medicine

## 2020-06-01 DIAGNOSIS — I10 Essential (primary) hypertension: Secondary | ICD-10-CM

## 2020-06-01 MED ORDER — HYDROCHLOROTHIAZIDE 25 MG PO TABS: 25.0000 mg | ORAL_TABLET | Freq: Every day | ORAL | 0 refills | Status: DC

## 2020-06-01 NOTE — Telephone Encounter (Signed)
Pt responded.  KP

## 2020-06-27 ENCOUNTER — Ambulatory Visit (INDEPENDENT_AMBULATORY_CARE_PROVIDER_SITE_OTHER): Payer: Self-pay | Admitting: Internal Medicine

## 2020-06-27 ENCOUNTER — Other Ambulatory Visit: Payer: Self-pay

## 2020-06-27 ENCOUNTER — Encounter: Payer: Self-pay | Admitting: Internal Medicine

## 2020-06-27 VITALS — BP 120/80 | HR 85 | Temp 98.1°F | Ht 62.0 in | Wt 172.0 lb

## 2020-06-27 DIAGNOSIS — K219 Gastro-esophageal reflux disease without esophagitis: Secondary | ICD-10-CM

## 2020-06-27 DIAGNOSIS — F419 Anxiety disorder, unspecified: Secondary | ICD-10-CM

## 2020-06-27 DIAGNOSIS — I10 Essential (primary) hypertension: Secondary | ICD-10-CM

## 2020-06-27 NOTE — Progress Notes (Signed)
Date:  06/27/2020   Name:  Andrea Massey   DOB:  1971-10-25   MRN:  [id number]   Chief Complaint: Hypertension (follow up )  Hypertension This is a chronic problem. The problem is uncontrolled (last visit - medications adjusted). Associated symptoms include chest pain (now resolved). Pertinent negatives include no headaches, palpitations, peripheral edema or shortness of breath. There are no associated agents to hypertension. Past treatments include angiotensin blockers and diuretics (increased irbesartan to 300 mg per day). The current treatment provides significant (BP at home were still 150-170 so she stopped checking) improvement. There are no compliance problems.   Gastroesophageal Reflux She complains of chest pain (now resolved) and heartburn. She reports no abdominal pain, no coughing, no sore throat or no wheezing. This is a recurrent problem. The problem occurs occasionally. The problem has been rapidly improving (since taking Pepcid for several weeks). The heartburn duration is less than a minute. The heartburn is located in the substernum. The heartburn is of mild intensity. The heartburn does not wake her from sleep. The heartburn does not limit her activity. The heartburn doesn't change with position. Pertinent negatives include no fatigue. She has tried a histamine-2 antagonist for the symptoms. The treatment provided significant relief.    Lab Results  Component Value Date   CREATININE 0.71 03/28/2020   BUN 13 03/28/2020   NA 138 03/28/2020   K 4.0 03/28/2020   CL 101 03/28/2020   CO2 28 03/28/2020   Lab Results  Component Value Date   CHOL 223 (H) 06/14/2018   HDL 84 06/14/2018   LDLCALC 128 (H) 06/14/2018   TRIG 56 06/14/2018   CHOLHDL 2.7 06/14/2018   Lab Results  Component Value Date   TSH 2.230 03/28/2020   Lab Results  Component Value Date   HGBA1C 5.6 06/14/2018   Lab Results  Component Value Date   WBC 10.5 03/28/2020   HGB 11.9 (L)  03/28/2020   HCT 37.5 03/28/2020   MCV 86.0 03/28/2020   PLT 358 03/28/2020   Lab Results  Component Value Date   ALT 13 03/28/2020   AST 23 03/28/2020   ALKPHOS 75 03/28/2020   BILITOT 0.6 03/28/2020     Review of Systems  Constitutional: Negative for chills, fatigue and unexpected weight change.  HENT: Negative for sore throat.   Respiratory: Negative for cough, chest tightness, shortness of breath and wheezing.   Cardiovascular: Positive for chest pain (now resolved). Negative for palpitations and leg swelling.  Gastrointestinal: Positive for heartburn. Negative for abdominal pain, blood in stool and rectal pain.       Gerd  Neurological: Positive for light-headedness (with rapid position changes). Negative for dizziness and headaches.  Psychiatric/Behavioral: Negative for dysphoric mood and sleep disturbance. The patient is not nervous/anxious (much improved with time - has not needed to take Vistaril).     Patient Active Problem List   Diagnosis Date Noted  . Anxiety disorder 05/14/2020  . Essential hypertension 08/31/2019  . Varicose veins of right leg with edema 08/31/2019  . Osteopenia 06/10/2018  . 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 Endometriosis 06/03/2016    Allergies  Allergen Reactions  . Amoxicillin Hives  . Penicillins Other (See Comments)    Unknown reaction    Past Surgical History:  Procedure Laterality Date  . CHOLECYSTECTOMY  11/2017  . HERNIA REPAIR    . KNEE SURGERY    . laparoscopy    . LEEP    . TUBAL LIGATION Bilateral  2009    Social History   Tobacco Use  . Smoking status: Never Smoker  . Smokeless tobacco: Never Used  Vaping Use  . Vaping Use: Never used  Substance Use Topics  . Alcohol use: Yes    Comment: occas  . Drug use: No     Medication list has been reviewed and updated.  Current Meds  Medication Sig  . Calcium Carbonate-Vitamin D (CALCIUM 500 + D PO) Take by mouth. 600+D  . fluticasone (FLONASE) 50 MCG/ACT nasal spray  Place into the nose.  . hydrochlorothiazide (HYDRODIURIL) 25 MG tablet Take 1 tablet (25 mg total) by mouth daily.  . hydrOXYzine (ATARAX/VISTARIL) 25 MG tablet Take 1 tablet (25 mg total) by mouth at bedtime as needed for anxiety.  . irbesartan (AVAPRO) 150 MG tablet Take 1 tablet (150 mg total) by mouth daily. (Patient taking differently: Take 150 mg by mouth daily. 300 mg daily)  . loratadine (ALLERGY) 10 MG tablet Take 10 mg by mouth daily.  . Multiple Vitamin (MULTIVITAMIN) capsule Take 1 capsule by mouth daily.    PHQ 2/9 Scores 05/14/2020 03/27/2020 01/06/2020 08/31/2019  PHQ - 2 Score 3 0 0 0  PHQ- 9 Score 5 0 - -    GAD 7 : Generalized Anxiety Score 05/14/2020 03/27/2020  Nervous, Anxious, on Edge 3 1  Control/stop worrying 3 1  Worry too much - different things 0 1  Trouble relaxing 0 0  Restless 0 0  Easily annoyed or irritable 3 0  Afraid - awful might happen 1 0  Total GAD 7 Score 10 3  Anxiety Difficulty Somewhat difficult Not difficult at all    BP Readings from Last 3 Encounters:  06/27/20 120/80  05/14/20 (!) 151/99  05/12/20 (!) 144/95    Physical Exam Vitals and nursing note reviewed.  Constitutional:      General: She is not in acute distress.    Appearance: Normal appearance. She is well-developed.  HENT:     Head: Normocephalic and atraumatic.  Cardiovascular:     Rate and Rhythm: Normal rate and regular rhythm.     Pulses: Normal pulses.     Heart sounds: No murmur heard.   Pulmonary:     Effort: Pulmonary effort is normal. No respiratory distress.     Breath sounds: No wheezing or rhonchi.  Musculoskeletal:     Cervical back: Normal range of motion.     Right lower leg: No edema.     Left lower leg: No edema.  Lymphadenopathy:     Cervical: No cervical adenopathy.  Skin:    General: Skin is warm and dry.     Capillary Refill: Capillary refill takes less than 2 seconds.     Findings: No rash.  Neurological:     Mental Status: She is alert and  oriented to person, place, and time.  Psychiatric:        Mood and Affect: Mood normal.     Wt Readings from Last 3 Encounters:  06/27/20 172 lb (78 kg)  05/14/20 172 lb (78 kg)  05/12/20 172 lb (78 kg)    BP 120/80   Pulse 85   Temp 98.1 F (36.7 C) (Oral)   Ht 5\' 2"  (1.575 m)   Wt 172 lb (78 kg)   SpO2 98%   BMI 31.46 kg/m   Assessment and Plan: 1. Essential hypertension Clinically stable exam with well controlled BP on increased dose of irbesartan and hctz. Tolerating medications without side effects  at this time. Pt to continue current regimen and low sodium diet; benefits of regular exercise as able discussed.  2. Gastroesophageal reflux disease, unspecified whether esophagitis present Symptoms well controlled since starting Pepcid. Chest pain has completely resolved. No red flag signs such as weight loss, n/v, melena Will continue Pepcid as needed. If symptoms recur, consider testing and/or GI evaluation  3. Anxiety disorder, unspecified type Improving with time Has Vistaril to use if needed for anxiety sx or sleep    Partially dictated using Animal nutritionist. Any errors are unintentional.  Bari Edward, MD [facility]  06/27/2020

## 2020-07-20 ENCOUNTER — Encounter: Payer: Self-pay | Admitting: Internal Medicine

## 2020-07-20 ENCOUNTER — Other Ambulatory Visit: Payer: Self-pay

## 2020-07-20 DIAGNOSIS — I10 Essential (primary) hypertension: Secondary | ICD-10-CM

## 2020-07-20 MED ORDER — IRBESARTAN 300 MG PO TABS: 300.0000 mg | ORAL_TABLET | Freq: Every day | ORAL | 0 refills | Status: DC

## 2020-08-28 ENCOUNTER — Other Ambulatory Visit: Payer: Self-pay | Admitting: Internal Medicine

## 2020-08-28 DIAGNOSIS — I10 Essential (primary) hypertension: Secondary | ICD-10-CM

## 2020-08-28 NOTE — Telephone Encounter (Signed)
Requested Prescriptions  Pending Prescriptions Disp Refills   hydrochlorothiazide (HYDRODIURIL) 25 MG tablet [Pharmacy Med Name: HYDROCHLOROTHIAZIDE 25MG  TABLETS] 90 tablet 1    Sig: TAKE 1 TABLET(25 MG) BY MOUTH DAILY     Cardiovascular: Diuretics - Thiazide Passed - 08/28/2020  3:36 AM      Passed - Ca in normal range and within 360 days    Calcium  Date Value Ref Range Status  03/28/2020 9.0 8.9 - 10.3 mg/dL Final   Calcium, Total  Date Value Ref Range Status  09/27/2012 8.7 8.5 - 10.1 mg/dL Final         Passed - Cr in normal range and within 360 days    Creatinine  Date Value Ref Range Status  09/27/2012 0.72 0.60 - 1.30 mg/dL Final   Creatinine, Ser  Date Value Ref Range Status  03/28/2020 0.71 0.44 - 1.00 mg/dL Final         Passed - K in normal range and within 360 days    Potassium  Date Value Ref Range Status  03/28/2020 4.0 3.5 - 5.1 mmol/L Final  09/27/2012 3.8 3.5 - 5.1 mmol/L Final         Passed - Na in normal range and within 360 days    Sodium  Date Value Ref Range Status  03/28/2020 138 135 - 145 mmol/L Final  09/27/2012 138 136 - 145 mmol/L Final         Passed - Last BP in normal range    BP Readings from Last 1 Encounters:  06/27/20 120/80         Passed - Valid encounter within last 6 months    Recent Outpatient Visits          2 months ago Essential hypertension   [facility] 08/27/20, MD   3 months ago Essential hypertension   [facility], [place]   5 months ago Essential hypertension   [facility], [place]   7 months ago Essential hypertension   [facility], MD   12 months ago Encounter for screening mammogram for breast cancer   [facility], MD      Future Appointments            In 2 months [name] Judithann Graves, MD [facility]

## 2020-09-05 ENCOUNTER — Encounter: Payer: Self-pay | Admitting: Internal Medicine

## 2020-10-18 ENCOUNTER — Other Ambulatory Visit: Payer: Self-pay

## 2020-10-18 ENCOUNTER — Encounter: Payer: Self-pay | Admitting: Internal Medicine

## 2020-10-18 DIAGNOSIS — I10 Essential (primary) hypertension: Secondary | ICD-10-CM

## 2020-10-18 MED ORDER — IRBESARTAN 300 MG PO TABS: 300.0000 mg | ORAL_TABLET | Freq: Every day | ORAL | 0 refills | Status: DC

## 2020-11-01 ENCOUNTER — Encounter: Payer: Self-pay | Admitting: Internal Medicine

## 2020-11-30 ENCOUNTER — Ambulatory Visit: Payer: Self-pay | Admitting: Internal Medicine

## 2020-12-03 ENCOUNTER — Other Ambulatory Visit: Payer: Self-pay | Admitting: Internal Medicine

## 2020-12-03 DIAGNOSIS — I10 Essential (primary) hypertension: Secondary | ICD-10-CM

## 2020-12-12 ENCOUNTER — Other Ambulatory Visit: Payer: Self-pay | Admitting: Internal Medicine

## 2020-12-12 DIAGNOSIS — I10 Essential (primary) hypertension: Secondary | ICD-10-CM

## 2020-12-13 ENCOUNTER — Encounter: Payer: Self-pay | Admitting: Internal Medicine

## 2021-01-15 ENCOUNTER — Other Ambulatory Visit: Payer: Self-pay | Admitting: Internal Medicine

## 2021-01-15 DIAGNOSIS — I10 Essential (primary) hypertension: Secondary | ICD-10-CM

## 2021-01-15 NOTE — Telephone Encounter (Signed)
  Notes to clinic: Patient has appointment with office tomorrow    Requested Prescriptions  Pending Prescriptions Disp Refills   irbesartan (AVAPRO) 300 MG tablet [Pharmacy Med Name: IRBESARTAN 300MG  TABLETS] 90 tablet 0    Sig: TAKE 1 TABLET(300 MG) BY MOUTH DAILY      Cardiovascular:  Angiotensin Receptor Blockers Failed - 01/15/2021  9:25 AM      Failed - Cr in normal range and within 180 days    Creatinine  Date Value Ref Range Status  09/27/2012 0.72 0.60 - 1.30 mg/dL Final   Creatinine, Ser  Date Value Ref Range Status  03/28/2020 0.71 0.44 - 1.00 mg/dL Final          Failed - K in normal range and within 180 days    Potassium  Date Value Ref Range Status  03/28/2020 4.0 3.5 - 5.1 mmol/L Final  09/27/2012 3.8 3.5 - 5.1 mmol/L Final          Failed - Valid encounter within last 6 months    Recent Outpatient Visits           6 months ago Essential hypertension   [facility] 13/09/2012, MD   8 months ago Essential hypertension   [facility], [place]   9 months ago Essential hypertension   [facility], [place]   1 year ago Essential hypertension   [facility], [place]   1 year ago Encounter for screening mammogram for breast cancer   [facility], MD       Future Appointments             Tomorrow [name], MD [facility]             Passed - Patient is not pregnant      Passed - Last BP in normal range    BP Readings from Last 1 Encounters:  06/27/20 120/80

## 2021-01-16 ENCOUNTER — Encounter: Payer: Self-pay | Admitting: Internal Medicine

## 2021-01-16 ENCOUNTER — Ambulatory Visit (INDEPENDENT_AMBULATORY_CARE_PROVIDER_SITE_OTHER): Payer: Self-pay | Admitting: Internal Medicine

## 2021-01-16 ENCOUNTER — Other Ambulatory Visit: Payer: Self-pay

## 2021-01-16 ENCOUNTER — Other Ambulatory Visit (HOSPITAL_COMMUNITY)
Admission: RE | Admit: 2021-01-16 | Discharge: 2021-01-16 | Disposition: A | Discharge status: Home / Self Care | Payer: Self-pay | Source: Ambulatory Visit | Attending: Internal Medicine | Admitting: Internal Medicine

## 2021-01-16 VITALS — BP 132/88 | HR 87 | Temp 97.8°F | Ht 62.0 in | Wt 173.0 lb

## 2021-01-16 DIAGNOSIS — I10 Essential (primary) hypertension: Secondary | ICD-10-CM

## 2021-01-16 DIAGNOSIS — Z0000 Encounter for general adult medical examination without abnormal findings: Secondary | ICD-10-CM

## 2021-01-16 DIAGNOSIS — Z1231 Encounter for screening mammogram for malignant neoplasm of breast: Secondary | ICD-10-CM

## 2021-01-16 DIAGNOSIS — Z124 Encounter for screening for malignant neoplasm of cervix: Secondary | ICD-10-CM | POA: Insufficient documentation

## 2021-01-16 DIAGNOSIS — Z1211 Encounter for screening for malignant neoplasm of colon: Secondary | ICD-10-CM

## 2021-01-16 LAB — POCT URINALYSIS DIPSTICK
Bilirubin, UA: NEGATIVE
Blood, UA: NEGATIVE
Glucose, UA: NEGATIVE
Ketones, UA: NEGATIVE
Leukocytes, UA: NEGATIVE
Nitrite, UA: NEGATIVE
Protein, UA: NEGATIVE
Spec Grav, UA: 1.02 (ref 1.010–1.025)
Urobilinogen, UA: 0.2 E.U./dL
pH, UA: 6 (ref 5.0–8.0)

## 2021-01-16 MED ORDER — IRBESARTAN 300 MG PO TABS: 300.0000 mg | ORAL_TABLET | Freq: Every day | ORAL | 3 refills | Status: DC

## 2021-01-16 NOTE — Progress Notes (Signed)
Date:  01/16/2021   Name:  Andrea Massey   DOB:  10/20/1971   MRN:  [id number]   Chief Complaint: Annual Exam (Breast exam with pap) and Colon Cancer Screening  Andrea Massey is a 50 y.o. female who presents today for her Complete Annual Exam. She feels well. She reports exercising walking X4 days a week. She reports she is sleeping fairly well. Breast complaints none.  Mammogram: 12/2019 DEXA: none Pap smear: 05/2016 neg with co-testing at GYN Colonoscopy: due  Immunization History  Administered Date(s) Administered  . Moderna Sars-Covid-2 Vaccination 01/28/2020, 02/29/2020    Hypertension This is a chronic problem. The problem is controlled. Pertinent negatives include no chest pain, headaches, palpitations or shortness of breath. Past treatments include angiotensin blockers and diuretics.    Lab Results  Component Value Date   CREATININE 0.71 03/28/2020   BUN 13 03/28/2020   NA 138 03/28/2020   K 4.0 03/28/2020   CL 101 03/28/2020   CO2 28 03/28/2020   Lab Results  Component Value Date   CHOL 223 (H) 06/14/2018   HDL 84 06/14/2018   LDLCALC 128 (H) 06/14/2018   TRIG 56 06/14/2018   CHOLHDL 2.7 06/14/2018   Lab Results  Component Value Date   TSH 2.230 03/28/2020   Lab Results  Component Value Date   HGBA1C 5.6 06/14/2018   Lab Results  Component Value Date   WBC 10.5 03/28/2020   HGB 11.9 (L) 03/28/2020   HCT 37.5 03/28/2020   MCV 86.0 03/28/2020   PLT 358 03/28/2020   Lab Results  Component Value Date   ALT 13 03/28/2020   AST 23 03/28/2020   ALKPHOS 75 03/28/2020   BILITOT 0.6 03/28/2020     Review of Systems  Constitutional: Negative for chills, fatigue and fever.  HENT: Negative for congestion, hearing loss, tinnitus, trouble swallowing and voice change.   Eyes: Negative for visual disturbance.  Respiratory: Negative for cough, chest tightness, shortness of breath and wheezing.   Cardiovascular: Negative for chest pain,  palpitations and leg swelling.  Gastrointestinal: Negative for abdominal pain, constipation, diarrhea and vomiting.  Endocrine: Negative for polydipsia and polyuria.  Genitourinary: Negative for dysuria, frequency, genital sores, vaginal bleeding and vaginal discharge.  Musculoskeletal: Negative for arthralgias, gait problem and joint swelling.  Skin: Negative for color change and rash.  Neurological: Negative for dizziness, tremors, light-headedness and headaches.  Hematological: Negative for adenopathy. Does not bruise/bleed easily.  Psychiatric/Behavioral: Negative for dysphoric mood and sleep disturbance. The patient is not nervous/anxious.     Patient Active Problem List   Diagnosis Date Noted  . Anxiety disorder 05/14/2020  . Essential hypertension 08/31/2019  . Varicose veins of right leg with edema 08/31/2019  . Osteopenia 06/10/2018  . 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 Endometriosis 06/03/2016    Allergies  Allergen Reactions  . Amoxicillin Hives  . Penicillins Other (See Comments)    Unknown reaction    Past Surgical History:  Procedure Laterality Date  . CHOLECYSTECTOMY  11/2017  . HERNIA REPAIR    . KNEE SURGERY    . laparoscopy    . LEEP    . TUBAL LIGATION Bilateral 2009    Social History   Tobacco Use  . Smoking status: Never Smoker  . Smokeless tobacco: Never Used  Vaping Use  . Vaping Use: Never used  Substance Use Topics  . Alcohol use: Yes    Comment: occas  . Drug use: No  Medication list has been reviewed and updated.  Current Meds  Medication Sig  . Calcium Carbonate-Vitamin D (CALCIUM 500 + D PO) Take by mouth. 600+D  . fluticasone (FLONASE) 50 MCG/ACT nasal spray Place into the nose.  . hydrochlorothiazide (HYDRODIURIL) 25 MG tablet TAKE 1 TABLET(25 MG) BY MOUTH DAILY  . irbesartan (AVAPRO) 300 MG tablet Take 1 tablet (300 mg total) by mouth daily.  Marland Kitchen loratadine (CLARITIN) 10 MG tablet Take 10 mg by mouth daily.  . Multiple Vitamin  (MULTIVITAMIN) capsule Take 1 capsule by mouth daily.  Marland Kitchen UNABLE TO FIND Med Name: Rich Fuchs    [facility] 2/9 Scores 01/16/2021 05/14/2020 03/27/2020 01/06/2020  PHQ - 2 Score 0 3 0 0  PHQ- 9 Score 0 5 0 -    GAD 7 : Generalized Anxiety Score 01/16/2021 05/14/2020 03/27/2020  Nervous, Anxious, on Edge 0 3 1  Control/stop worrying 0 3 1  Worry too much - different things 0 0 1  Trouble relaxing 0 0 0  Restless 0 0 0  Easily annoyed or irritable 0 3 0  Afraid - awful might happen 0 1 0  Total GAD 7 Score 0 10 3  Anxiety Difficulty - Somewhat difficult Not difficult at all    BP Readings from Last 3 Encounters:  01/16/21 132/88  06/27/20 120/80  05/14/20 (!) 151/99    Physical Exam Vitals and nursing note reviewed.  Constitutional:      General: She is not in acute distress.    Appearance: She is well-developed.  HENT:     Head: Normocephalic and atraumatic.     Right Ear: Tympanic membrane and ear canal normal.     Left Ear: Tympanic membrane and ear canal normal.     Nose:     Right Sinus: No maxillary sinus tenderness.     Left Sinus: No maxillary sinus tenderness.  Eyes:     General: No scleral icterus.       Right eye: No discharge.        Left eye: No discharge.     Conjunctiva/sclera: Conjunctivae normal.  Neck:     Thyroid: No thyromegaly.     Vascular: No carotid bruit.  Cardiovascular:     Rate and Rhythm: Normal rate and regular rhythm.     Pulses: Normal pulses.     Heart sounds: Normal heart sounds.  Pulmonary:     Effort: Pulmonary effort is normal. No respiratory distress.     Breath sounds: No wheezing.  Chest:  Breasts:     Right: No mass, nipple discharge, skin change or tenderness.     Left: No mass, nipple discharge, skin change or tenderness.    Abdominal:     General: Bowel sounds are normal.     Palpations: Abdomen is soft.     Tenderness: There is no abdominal tenderness.  Genitourinary:    Labia:        Right: No tenderness, lesion or  injury.        Left: No tenderness, lesion or injury.      Vagina: Normal.     Cervix: Normal.     Uterus: Normal.      Adnexa: Right adnexa normal and left adnexa normal.  Musculoskeletal:     Cervical back: Normal range of motion. No erythema.     Right lower leg: No edema.     Left lower leg: No edema.  Lymphadenopathy:     Cervical: No cervical adenopathy.  Skin:  General: Skin is warm and dry.     Findings: No rash.  Neurological:     Mental Status: She is alert and oriented to person, place, and time.     Cranial Nerves: No cranial nerve deficit.     Sensory: No sensory deficit.     Deep Tendon Reflexes: Reflexes are normal and symmetric.  Psychiatric:        Attention and Perception: Attention normal.        Mood and Affect: Mood normal.     Wt Readings from Last 3 Encounters:  01/16/21 173 lb (78.5 kg)  06/27/20 172 lb (78 kg)  05/14/20 172 lb (78 kg)    BP 132/88   Pulse 87   Temp 97.8 F (36.6 C) (Oral)   Ht 5\' 2"  (1.575 m)   Wt 173 lb (78.5 kg)   SpO2 96%   BMI 31.64 kg/m   Assessment and Plan: 1. Annual physical exam Normal exam - continue efforts at diet and exercise for weight loss. Consider Shingles vaccine. - Comprehensive metabolic panel - Lipid panel  2. Encounter for screening mammogram for breast cancer Schedule at [facility] -  3. Colon cancer screening Due for colonoscopy - husband is likely getting insurance soon so she will call when it goes into effect and will be referred at that time.  4. Essential hypertension Clinically stable exam with well controlled BP. Tolerating medications without side effects at this time. Pt to continue current regimen and low sodium diet; benefits of regular exercise as able discussed. - CBC with Differential/Platelet - TSH - POCT urinalysis dipstick - irbesartan (AVAPRO) 300 MG tablet; Take 1 tablet (300 mg total) by mouth daily.  Dispense: 90 tablet; Refill: 3  5. Encounter for screening for  cervical cancer Pap obtained - Cytology - PAP   Partially dictated using Dragon software. Any errors are unintentional.  [facility], MD [facility]  01/16/2021

## 2021-01-17 LAB — COMPREHENSIVE METABOLIC PANEL
ALT: 13 IU/L (ref 0–32)
AST: 19 IU/L (ref 0–40)
Albumin/Globulin Ratio: 1.7 (ref 1.2–2.2)
Albumin: 4.7 g/dL (ref 3.8–4.8)
Alkaline Phosphatase: 96 IU/L (ref 44–121)
BUN/Creatinine Ratio: 15 (ref 9–23)
BUN: 13 mg/dL (ref 6–24)
Bilirubin Total: 0.4 mg/dL (ref 0.0–1.2)
CO2: 26 mmol/L (ref 20–29)
Calcium: 9.9 mg/dL (ref 8.7–10.2)
Chloride: 96 mmol/L (ref 96–106)
Creatinine, Ser: 0.86 mg/dL (ref 0.57–1.00)
Globulin, Total: 2.8 g/dL (ref 1.5–4.5)
Glucose: 84 mg/dL (ref 65–99)
Potassium: 4.1 mmol/L (ref 3.5–5.2)
Sodium: 136 mmol/L (ref 134–144)
Total Protein: 7.5 g/dL (ref 6.0–8.5)
eGFR: 82 mL/min/{1.73_m2} (ref >59)

## 2021-01-17 LAB — CBC WITH DIFFERENTIAL/PLATELET
Basophils Absolute: 0 10*3/uL (ref 0.0–0.2)
Basos: 0 %
EOS (ABSOLUTE): 0.1 10*3/uL (ref 0.0–0.4)
Eos: 1 %
Hematocrit: 34.1 % (ref 34.0–46.6)
Hemoglobin: 11.5 g/dL (ref 11.1–15.9)
Immature Grans (Abs): 0 10*3/uL (ref 0.0–0.1)
Immature Granulocytes: 0 %
Lymphocytes Absolute: 3 10*3/uL (ref 0.7–3.1)
Lymphs: 29 %
MCH: 28.4 pg (ref 26.6–33.0)
MCHC: 33.7 g/dL (ref 31.5–35.7)
MCV: 84 fL (ref 79–97)
Monocytes Absolute: 0.7 10*3/uL (ref 0.1–0.9)
Monocytes: 6 %
Neutrophils Absolute: 6.5 10*3/uL (ref 1.4–7.0)
Neutrophils: 64 %
Platelets: 338 10*3/uL (ref 150–450)
RBC: 4.05 x10E6/uL (ref 3.77–5.28)
RDW: 13.5 % (ref 11.7–15.4)
WBC: 10.3 10*3/uL (ref 3.4–10.8)

## 2021-01-17 LAB — LIPID PANEL
Chol/HDL Ratio: 2.8 ratio (ref 0.0–4.4)
Cholesterol, Total: 233 mg/dL — ABNORMAL HIGH (ref 100–199)
HDL: 83 mg/dL (ref >39)
LDL Chol Calc (NIH): 141 mg/dL — ABNORMAL HIGH (ref 0–99)
Triglycerides: 53 mg/dL (ref 0–149)
VLDL Cholesterol Cal: 9 mg/dL (ref 5–40)

## 2021-01-17 LAB — TSH: TSH: 2.24 u[IU]/mL (ref 0.450–4.500)

## 2021-01-18 LAB — CYTOLOGY - PAP
Adequacy: ABSENT
Comment: NEGATIVE
Diagnosis: UNDETERMINED — AB
High risk HPV: NEGATIVE

## 2021-03-11 ENCOUNTER — Other Ambulatory Visit: Payer: Self-pay | Admitting: Internal Medicine

## 2021-03-11 DIAGNOSIS — I10 Essential (primary) hypertension: Secondary | ICD-10-CM

## 2021-06-13 ENCOUNTER — Other Ambulatory Visit: Payer: Self-pay | Admitting: Internal Medicine

## 2021-06-13 DIAGNOSIS — I10 Essential (primary) hypertension: Secondary | ICD-10-CM

## 2021-06-13 NOTE — Telephone Encounter (Signed)
Requested Prescriptions  Pending Prescriptions Disp Refills  . hydrochlorothiazide (HYDRODIURIL) 25 MG tablet [Pharmacy Med Name: HYDROCHLOROTHIAZIDE 25MG  TABLETS] 90 tablet 0    Sig: TAKE 1 TABLET(25 MG) BY MOUTH DAILY     Cardiovascular: Diuretics - Thiazide Passed - 06/13/2021  3:42 AM      Passed - Ca in normal range and within 360 days    Calcium  Date Value Ref Range Status  01/16/2021 9.9 8.7 - 10.2 mg/dL Final   Calcium, Total  Date Value Ref Range Status  09/27/2012 8.7 8.5 - 10.1 mg/dL Final         Passed - Cr in normal range and within 360 days    Creatinine  Date Value Ref Range Status  09/27/2012 0.72 0.60 - 1.30 mg/dL Final   Creatinine, Ser  Date Value Ref Range Status  01/16/2021 0.86 0.57 - 1.00 mg/dL Final         Passed - K in normal range and within 360 days    Potassium  Date Value Ref Range Status  01/16/2021 4.1 3.5 - 5.2 mmol/L Final  09/27/2012 3.8 3.5 - 5.1 mmol/L Final         Passed - Na in normal range and within 360 days    Sodium  Date Value Ref Range Status  01/16/2021 136 134 - 144 mmol/L Final  09/27/2012 138 136 - 145 mmol/L Final         Passed - Last BP in normal range    BP Readings from Last 1 Encounters:  01/16/21 132/88         Passed - Valid encounter within last 6 months    Recent Outpatient Visits          4 months ago Annual physical exam   [facility], [place]   11 months ago Essential hypertension   [facility] [place]   1 year ago Essential hypertension   [facility] [place]   1 year ago Essential hypertension   [facility] [place]   1 year ago Essential hypertension   [facility] [place]      Future Appointments            In 7 months [name] Judithann Graves, MD [facility], [facility]

## 2021-08-23 IMAGING — MG DIGITAL SCREENING BILAT W/ TOMO W/ CAD
8 series · 8 of 24 positions shown · non-contrast
Comparison: Previous exam(s).

CLINICAL DATA: Screening.

EXAM:
DIGITAL SCREENING BILATERAL MAMMOGRAM WITH TOMO AND CAD

[L MLO synth-2D]
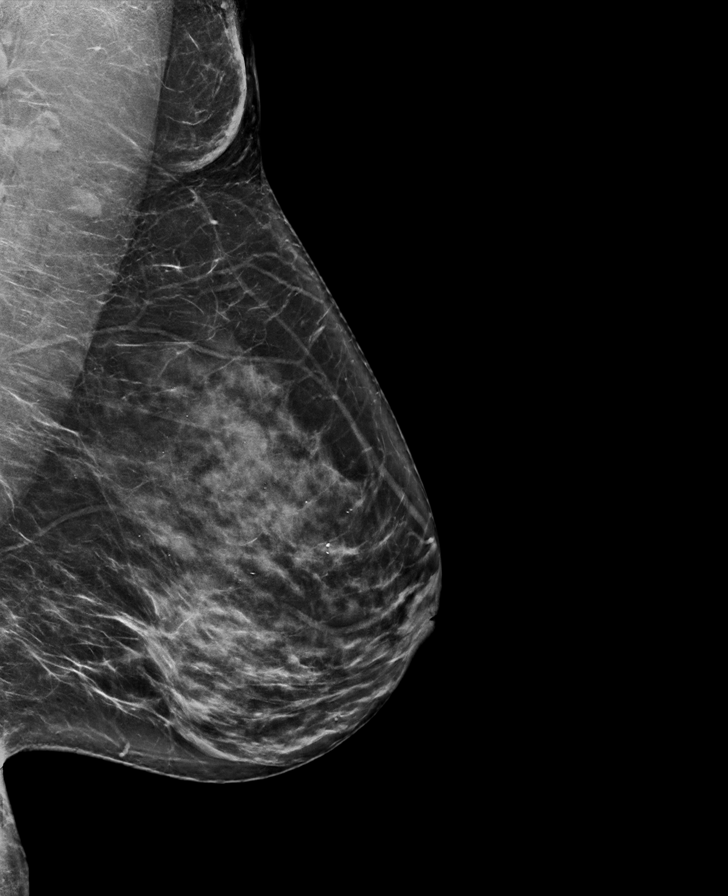

[L CC synth-2D]
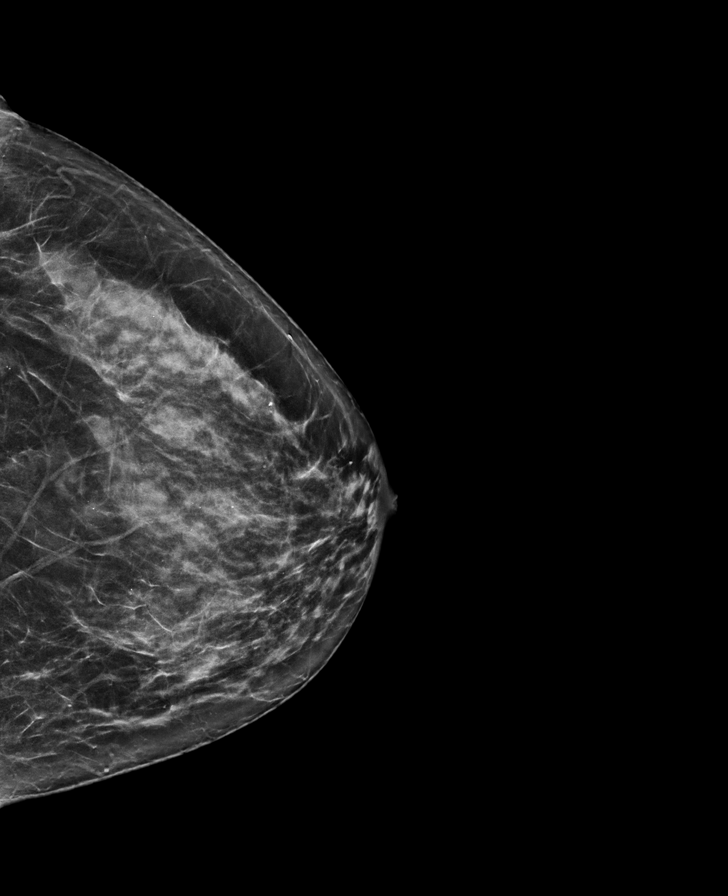

[R MLO synth-2D]
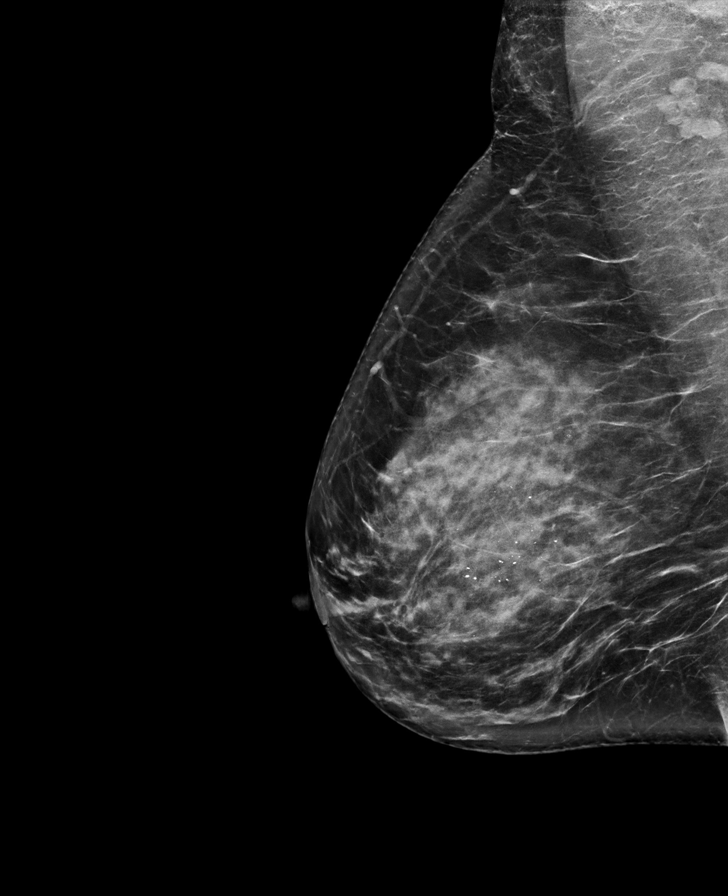

[R CC synth-2D]
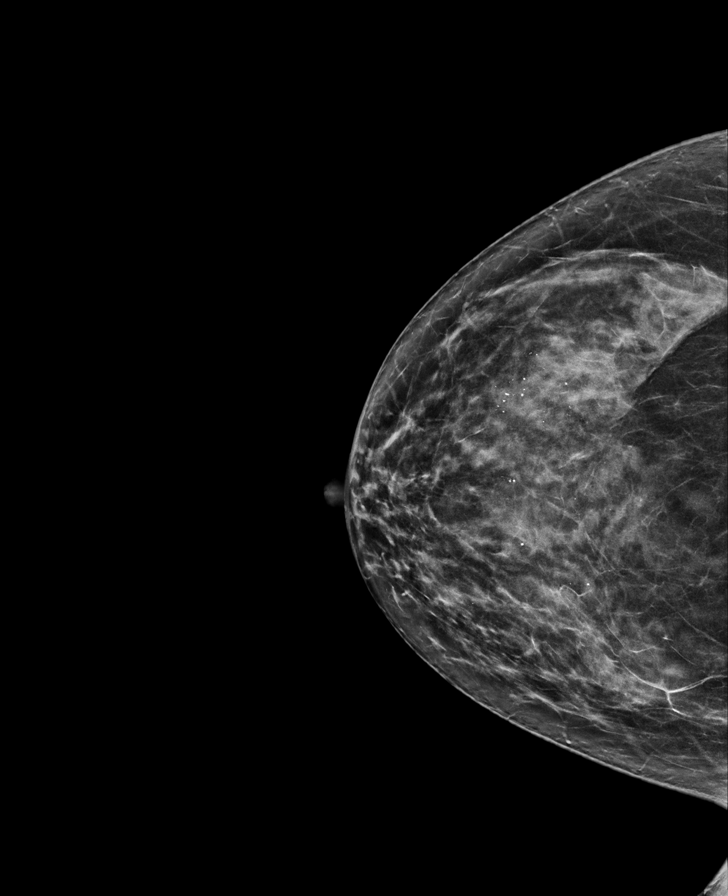

[L MLO tomo · tomo slice 41/80.0]
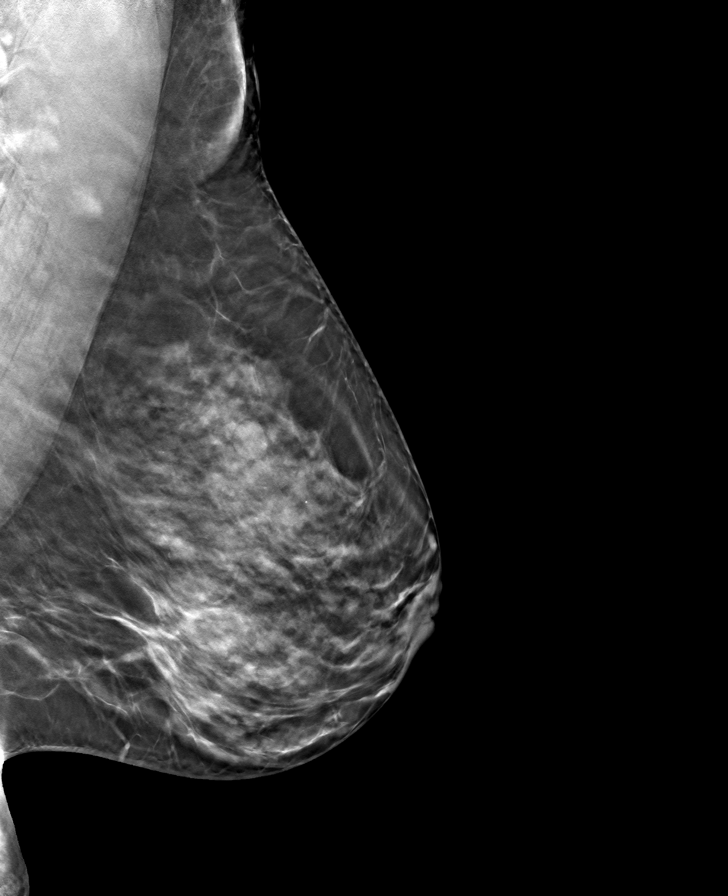

[L CC tomo · tomo slice 33/66.0]
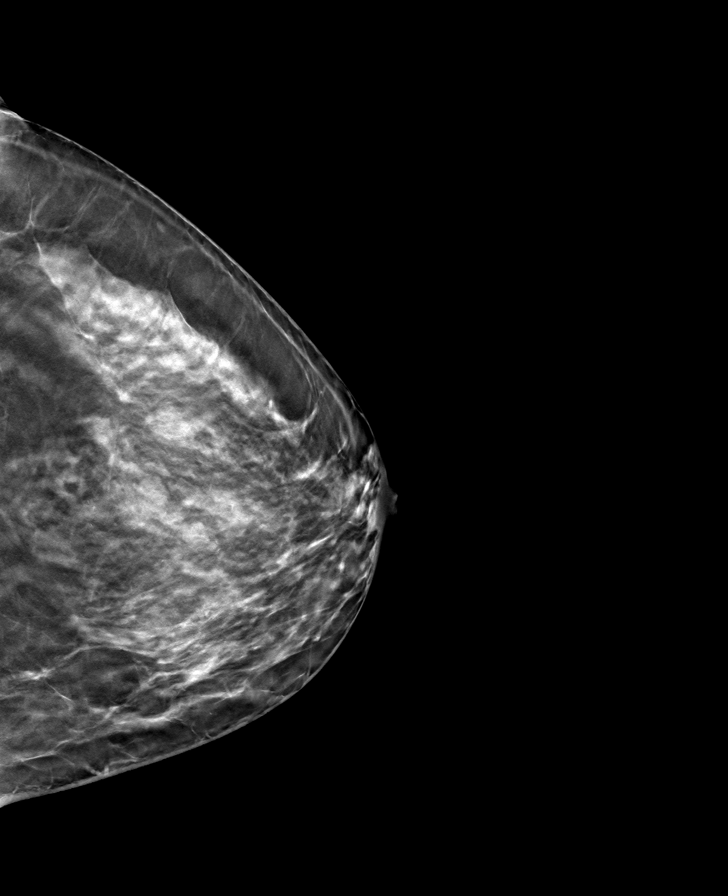

[R CC tomo · tomo slice 35/68.0]
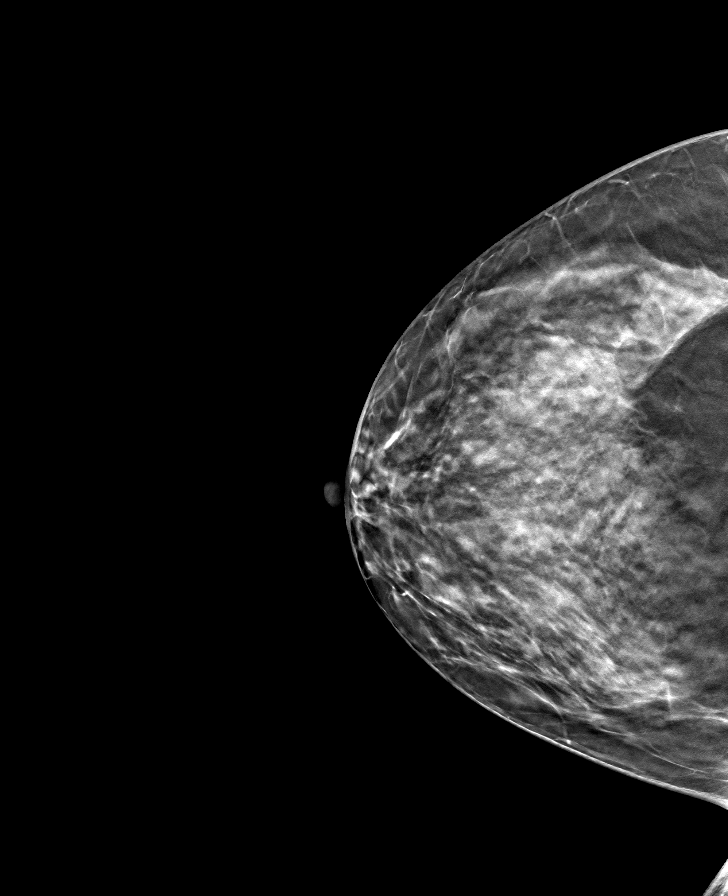

[R MLO tomo · tomo slice 41/81.0]
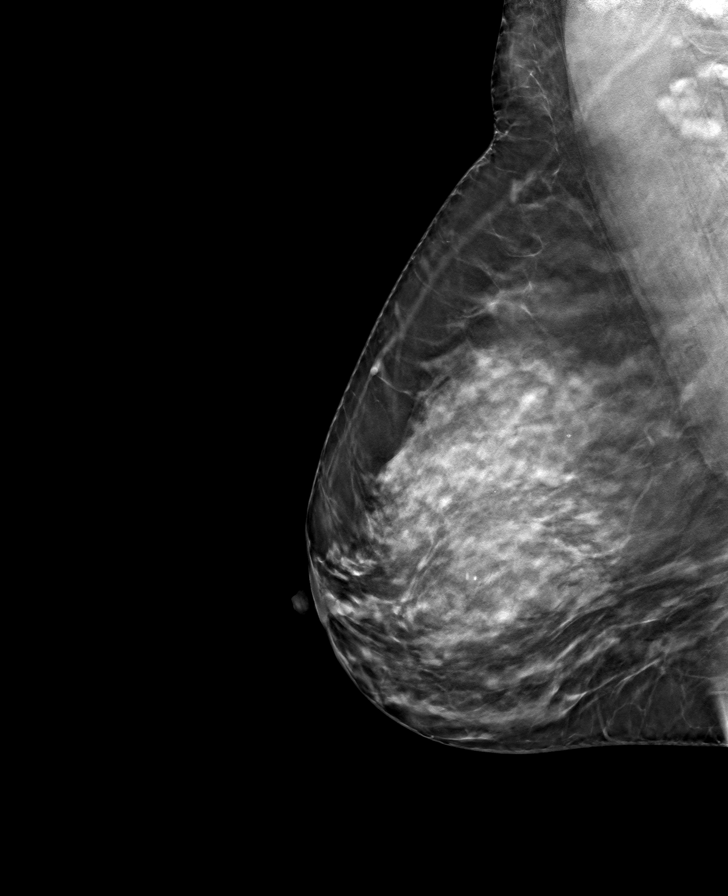

[8 of 24 positions shown; findings below may reference images not displayed]

ACR Breast Density Category d: The breast tissue is extremely dense,
which lowers the sensitivity of mammography
FINDINGS: There are no findings suspicious for malignancy. Images were
processed with CAD.
IMPRESSION: No mammographic evidence of malignancy. A result letter of this
screening mammogram will be mailed directly to the patient.

RECOMMENDATION:
Screening mammogram in one year. (Code:[id number]-[id number])

BI-RADS CATEGORY  1: Negative.

## 2021-09-10 ENCOUNTER — Other Ambulatory Visit: Payer: Self-pay | Admitting: Internal Medicine

## 2021-09-10 DIAGNOSIS — I10 Essential (primary) hypertension: Secondary | ICD-10-CM

## 2021-09-10 NOTE — Telephone Encounter (Signed)
Seen 01/16/21, provider asked to return in one year, appt made.  Future Appointments            In 4 months Andrea Massey, Andrea Cowden, MD [facility], [facility]

## 2021-12-07 ENCOUNTER — Other Ambulatory Visit: Payer: Self-pay

## 2021-12-07 ENCOUNTER — Ambulatory Visit
Admission: EM | Admit: 2021-12-07 | Discharge: 2021-12-07 | Disposition: A | Discharge status: Home / Self Care | Payer: Self-pay | Attending: Emergency Medicine | Admitting: Emergency Medicine

## 2021-12-07 ENCOUNTER — Encounter: Payer: Self-pay | Admitting: Emergency Medicine

## 2021-12-07 DIAGNOSIS — U071 COVID-19: Secondary | ICD-10-CM

## 2021-12-07 MED ORDER — MOLNUPIRAVIR EUA 200MG CAPSULE: 4.0000 | ORAL_CAPSULE | Freq: Two times a day (BID) | ORAL | 0 refills | Status: AC

## 2021-12-07 NOTE — ED Triage Notes (Signed)
Patient states that she took a home covid test this morning and it was positive.  Patient c/o cough, nasal congestion, lose stools and sinus drainage that started on Wed.  Patient denies fevers.

## 2021-12-07 NOTE — ED Provider Notes (Signed)
[facility]    CSN: [id number] Arrival date & time: 12/07/21  8338      History   Chief Complaint Chief Complaint  Patient presents with   Cough    COVID Positive   Sinus Problem    HPI Dianna Ewald Froio is a 51 y.o. female.   Patient presents with chills, fever, nasal congestion, rhinorrhea, nonproductive cough, postnasal drip, mild generalized headaches and diarrhea for 3 days.  No known sick contacts.  Home COVID test positive.  Has not attempted treatment.  History of hypertension, osteoporosis, anxiety.  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 Authorizing Provider  hydrochlorothiazide (HYDRODIURIL) 25 MG tablet TAKE 1 TABLET(25 MG) BY MOUTH DAILY 09/10/21  Yes [name], MD  irbesartan (AVAPRO) 300 MG tablet Take 1 tablet (300 mg total) by mouth daily. 01/16/21  Yes [name], MD  Multiple Vitamin (MULTIVITAMIN) capsule Take 1 capsule by mouth daily.   Yes [provider]  Calcium Carbonate-Vitamin D (CALCIUM 500 + D PO) Take by mouth. 600+D    [provider]  fluticasone  (FLONASE) 50 MCG/ACT nasal spray Place into the nose. 03/02/08   [provider]  hydrOXYzine (ATARAX/VISTARIL) 25 MG tablet Take 1 tablet (25 mg total) by mouth at bedtime as needed for anxiety. Patient not taking: Reported on 01/16/2021 05/21/20   [name], MD  loratadine (CLARITIN) 10 MG tablet Take 10 mg by mouth daily.    [provider]  UNABLE TO FIND Med Name: [facility] gummies    [provider]    Family History Family History  Problem Relation Age of Onset   Cerebral aneurysm Mother    Lung cancer Father    Heart disease Paternal Aunt    Breast cancer Other        paternal great-aunt   Lung cancer Brother    Cancer Neg Hx    Diabetes Neg Hx     Social History Social History   Tobacco Use   Smoking status: Never   Smokeless tobacco: Never  Vaping Use   Vaping Use: Never used  Substance Use Topics   Alcohol use: Yes    Comment: occas  Drug use: No     Allergies   Amoxicillin and Penicillins   Review of Systems Review of Systems  Constitutional:  Positive for chills and fever. Negative for activity change, appetite change, diaphoresis, fatigue and unexpected weight change.  HENT:  Positive for congestion, drooling and rhinorrhea. Negative for dental problem, ear discharge, ear pain, facial swelling, hearing loss, mouth sores, nosebleeds, postnasal drip, sinus pressure, sinus pain, sneezing, sore throat, tinnitus, trouble swallowing and voice change.   Respiratory:  Positive for cough. Negative for apnea, choking, chest tightness, shortness of breath, wheezing and stridor.   Cardiovascular: Negative.   Gastrointestinal:  Positive for diarrhea. Negative for abdominal distention, abdominal pain, anal bleeding, blood in stool, constipation, nausea, rectal pain and vomiting.  Skin: Negative.   Neurological:  Positive for headaches. Negative for dizziness, tremors, seizures, syncope, facial asymmetry, speech difficulty, weakness,  light-headedness and numbness.    Physical Exam Triage Vital Signs ED Triage Vitals  Enc Vitals Group     BP 12/07/21 0857 126/78     Pulse Rate 12/07/21 0857 (!) 116     Resp 12/07/21 0857 15     Temp 12/07/21 0857 (!) 101.1 F (38.4 C)     Temp Source 12/07/21 0857 Oral     SpO2 12/07/21 0857 99 %     Weight 12/07/21 0853 169 lb (76.7 kg)     Height 12/07/21 0853 5\' 2"  (1.575 m)     Head Circumference --      Peak Flow --      Pain Score 12/07/21 0852 5     Pain Loc --      Pain Edu? --      Excl. in GC? --    No data found.  Updated Vital Signs BP 126/78 (BP Location: Right Arm)    Pulse (!) 116    Temp (!) 101.1 F (38.4 C) (Oral) Comment: Patient refused medication at this time for her fever   Resp 15    Ht 5\' 2"  (1.575 m)    Wt 169 lb (76.7 kg)    SpO2 99%    BMI 30.91 kg/m   Visual Acuity Right Eye Distance:   Left Eye Distance:   Bilateral Distance:    Right Eye Near:   Left Eye Near:    Bilateral Near:     Physical Exam Constitutional:      Appearance: Normal appearance.  HENT:     Head: Normocephalic.     Right Ear: Tympanic membrane, ear canal and external ear normal.     Left Ear: Tympanic membrane, ear canal and external ear normal.     Nose: Congestion and rhinorrhea present.     Mouth/Throat:     Mouth: Mucous membranes are moist.     Pharynx: Posterior oropharyngeal erythema present.  Eyes:     Extraocular Movements: Extraocular movements intact.  Cardiovascular:     Rate and Rhythm: Normal rate and regular rhythm.     Pulses: Normal pulses.     Heart sounds: Normal heart sounds.  Pulmonary:     Effort: Pulmonary effort is normal.     Breath sounds: Normal breath sounds.  Musculoskeletal:     Cervical back: Normal range of motion and neck supple.  Skin:    General: Skin is warm and dry.  Neurological:     Mental Status: She is alert and oriented to person, place, and time. Mental status is at baseline.  Psychiatric:  Mood and  Affect: Mood normal.        Behavior: Behavior normal.     UC Treatments / Results  Labs (all labs ordered are listed, but only abnormal results are displayed) Labs Reviewed - No data to display  EKG   Radiology No results found.  Procedures Procedures (including critical care time)  Medications Ordered in UC Medications - No data to display  Initial Impression / Assessment and Plan / UC Course  I have reviewed the triage vital signs and the nursing notes.  Pertinent labs & imaging results that were available during my care of the patient were reviewed by me and considered in my medical decision making (see chart for details).  COVID-19  Home COVID test positive, will not retest in office, antiviral prescribed for 5-day course, over-the-counter medications for symptomatic management, urgent care follow-up as needed Final Clinical Impressions(s) / UC Diagnoses   Final diagnoses:  None   Discharge Instructions   None    ED Prescriptions   None    PDMP not reviewed this encounter.   Valinda HoarWhite, Chattie Greeson R, [place]NP 12/07/21 [id number]

## 2021-12-07 NOTE — Discharge Instructions (Addendum)
Covid 19 is a virus and should steadily improve in time it can take up to 2 weeks before you truly start to see feel like yourself  Take the antiviral twice a day for the next 5 days  You may attempt any of the following below to help manage her symptoms    You can take Tylenol and/or Ibuprofen as needed for fever reduction and pain relief.   For cough: honey 1/2 to 1 teaspoon (you can dilute the honey in water or another fluid).  You can also use guaifenesin and dextromethorphan for cough. You can use a humidifier for chest congestion and cough.  If you don't have a humidifier, you can sit in the bathroom with the hot shower running.      For sore throat: try warm salt water gargles, cepacol lozenges, throat spray, warm tea or water with lemon/honey, popsicles or ice, or OTC cold relief medicine for throat discomfort.   For congestion: take a daily anti-histamine like Zyrtec, Claritin, and a oral decongestant, such as pseudoephedrine.  You can also use Flonase 1-2 sprays in each nostril daily.   It is important to stay hydrated: drink plenty of fluids (water, gatorade/powerade/pedialyte, juices, or teas) to keep your throat moisturized and help further relieve irritation/discomfort.

## 2021-12-25 ENCOUNTER — Encounter: Payer: Self-pay | Admitting: Internal Medicine

## 2022-01-17 ENCOUNTER — Other Ambulatory Visit: Payer: Self-pay | Admitting: Internal Medicine

## 2022-01-17 DIAGNOSIS — I10 Essential (primary) hypertension: Secondary | ICD-10-CM

## 2022-01-17 NOTE — Telephone Encounter (Signed)
Requested medication (s) are due for refill today: Yes ? ?Requested medication (s) are on the active medication list: Yes ? ?Last refill:  01/16/21 ? ?Future visit scheduled: Yes ? ?Notes to clinic:  Prescription has expired. ? ? ? ?Requested Prescriptions  ?Pending Prescriptions Disp Refills  ? irbesartan (AVAPRO) 300 MG tablet [Pharmacy Med Name: IRBESARTAN 300MG  TABLETS] 90 tablet 3  ?  Sig: TAKE 1 TABLET(300 MG) BY MOUTH DAILY  ?  ? Cardiovascular:  Angiotensin Receptor Blockers Failed - 01/17/2022  3:37 AM  ?  ?  Failed - Cr in normal range and within 180 days  ?  Creatinine  ?Date Value Ref Range Status  ?09/27/2012 0.72 0.60 - 1.30 mg/dL Final  ? ?Creatinine, Ser  ?Date Value Ref Range Status  ?01/16/2021 0.86 0.57 - 1.00 mg/dL Final  ?  ?  ?  ?  Failed - K in normal range and within 180 days  ?  Potassium  ?Date Value Ref Range Status  ?01/16/2021 4.1 3.5 - 5.2 mmol/L Final  ?09/27/2012 3.8 3.5 - 5.1 mmol/L Final  ?  ?  ?  ?  Failed - Valid encounter within last 6 months  ?  Recent Outpatient Visits   ? ?      ? 1 year ago Annual physical exam  ? [facility], [place]  ? 1 year ago Essential hypertension  ? [facility], [place]  ? 1 year ago Essential hypertension  ? [facility], [place]  ? 1 year ago Essential hypertension  ? [facility], [place]  ? 2 years ago Essential hypertension  ? [facility], [place]  ? ?  ?  ?Future Appointments   ? ?        ? In 1 month [name] Judithann Graves, MD [facility]  ? ?  ? ?  ?  ?  Passed - Patient is not pregnant  ?  ?  Passed - Last BP in normal range  ?  BP Readings from Last 1 Encounters:  ?12/07/21 126/78  ?  ?  ?  ?  ? ?

## 2022-01-21 ENCOUNTER — Encounter: Payer: Self-pay | Admitting: Internal Medicine

## 2022-03-04 ENCOUNTER — Telehealth: Payer: Self-pay

## 2022-03-04 ENCOUNTER — Ambulatory Visit (INDEPENDENT_AMBULATORY_CARE_PROVIDER_SITE_OTHER): Payer: Self-pay | Admitting: Internal Medicine

## 2022-03-04 ENCOUNTER — Other Ambulatory Visit (HOSPITAL_COMMUNITY)
Admission: RE | Admit: 2022-03-04 | Discharge: 2022-03-04 | Disposition: A | Discharge status: Home / Self Care | Payer: Self-pay | Source: Ambulatory Visit | Attending: Internal Medicine | Admitting: Internal Medicine

## 2022-03-04 ENCOUNTER — Encounter: Payer: Self-pay | Admitting: Internal Medicine

## 2022-03-04 VITALS — BP 100/68 | HR 95 | Ht 62.0 in | Wt 171.2 lb

## 2022-03-04 DIAGNOSIS — Z1231 Encounter for screening mammogram for malignant neoplasm of breast: Secondary | ICD-10-CM

## 2022-03-04 DIAGNOSIS — I10 Essential (primary) hypertension: Secondary | ICD-10-CM

## 2022-03-04 DIAGNOSIS — Z1159 Encounter for screening for other viral diseases: Secondary | ICD-10-CM

## 2022-03-04 DIAGNOSIS — Z124 Encounter for screening for malignant neoplasm of cervix: Secondary | ICD-10-CM | POA: Insufficient documentation

## 2022-03-04 DIAGNOSIS — Z0000 Encounter for general adult medical examination without abnormal findings: Secondary | ICD-10-CM

## 2022-03-04 DIAGNOSIS — Z1211 Encounter for screening for malignant neoplasm of colon: Secondary | ICD-10-CM

## 2022-03-04 NOTE — Progress Notes (Signed)
? ? ?Date:  03/04/2022  ? ?Name:  Andrea Massey   DOB:  01/15/1971   MRN:  [id number] ? ? ?Chief Complaint: Annual Exam (Breast Exam with Pap. ) ?Andrea Massey is a 51 y.o. female who presents today for her Complete Annual Exam. She feels well. She reports exercising. She reports she is sleeping well. Breast complaints - none. ? ?Mammogram: 12/2019 ?DEXA: none ?Pap smear: 01/2021 ASCUS neg HPV ?Colonoscopy: none ? ?Health Maintenance Due  ?Topic Date Due  ? HIV Screening  Never done  ? Hepatitis C Screening  Never done  ? TETANUS/TDAP  Never done  ? COLONOSCOPY (Pts 45-79yr Insurance coverage will need to be confirmed)  Never done  ? Zoster Vaccines- Shingrix (1 of 2) Never done  ? MAMMOGRAM  01/03/2021  ? PAP SMEAR-Modifier  01/16/2022  ?  ?Immunization History  ?Administered Date(s) Administered  ? Moderna Sars-Covid-2 Vaccination 01/28/2020, 02/29/2020  ? ? ?Hypertension ?This is a chronic problem. The problem is controlled. Pertinent negatives include no chest pain, headaches, palpitations or shortness of breath. Past treatments include angiotensin blockers and diuretics. The current treatment provides significant improvement.  ? ?Lab Results  ?Component Value Date  ? NA 136 01/16/2021  ? K 4.1 01/16/2021  ? CO2 26 01/16/2021  ? GLUCOSE 84 01/16/2021  ? BUN 13 01/16/2021  ? CREATININE 0.86 01/16/2021  ? CALCIUM 9.9 01/16/2021  ? EGFR 82 01/16/2021  ? GFRNONAA >60 03/28/2020  ? ?Lab Results  ?Component Value Date  ? CHOL 233 (H) 01/16/2021  ? HDL 83 01/16/2021  ? LDLCALC 141 (H) 01/16/2021  ? TRIG 53 01/16/2021  ? CHOLHDL 2.8 01/16/2021  ? ?Lab Results  ?Component Value Date  ? TSH 2.240 01/16/2021  ? ?Lab Results  ?Component Value Date  ? HGBA1C 5.6 06/14/2018  ? ?Lab Results  ?Component Value Date  ? WBC 10.3 01/16/2021  ? HGB 11.5 01/16/2021  ? HCT 34.1 01/16/2021  ? MCV 84 01/16/2021  ? PLT 338 01/16/2021  ? ?Lab Results  ?Component Value Date  ? ALT 13 01/16/2021  ? AST 19 01/16/2021  ?  ALKPHOS 96 01/16/2021  ? BILITOT 0.4 01/16/2021  ? ?No results found for: 25OHVITD2, 2[place] VD25OH  ? ?Review of Systems  ?Constitutional:  Negative for chills, fatigue and fever.  ?HENT:  Negative for congestion, hearing loss, tinnitus, trouble swallowing and voice change.   ?Eyes:  Negative for visual disturbance.  ?Respiratory:  Negative for cough, chest tightness, shortness of breath and wheezing.   ?Cardiovascular:  Negative for chest pain, palpitations and leg swelling.  ?Gastrointestinal:  Negative for abdominal pain, constipation, diarrhea and vomiting.  ?Endocrine: Negative for polydipsia and polyuria.  ?Genitourinary:  Negative for dysuria, frequency, genital sores, vaginal bleeding and vaginal discharge.  ?Musculoskeletal:  Negative for arthralgias, gait problem and joint swelling.  ?Skin:  Negative for color change and rash.  ?Neurological:  Negative for dizziness, tremors, light-headedness and headaches.  ?Hematological:  Negative for adenopathy. Does not bruise/bleed easily.  ?Psychiatric/Behavioral:  Negative for dysphoric mood and sleep disturbance. The patient is not nervous/anxious.   ? ?Patient Active Problem List  ? Diagnosis Date Noted  ? Anxiety disorder 05/14/2020  ? Essential hypertension 08/31/2019  ? Varicose veins of right leg with edema 08/31/2019  ? Osteopenia 06/10/2018  ? 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 Endometriosis 06/03/2016  ? ? ?Allergies  ?Allergen Reactions  ? Amoxicillin Hives  ? Penicillins Other (See Comments)  ?  Unknown reaction  ? ? ?  Past Surgical History:  ?Procedure Laterality Date  ? CHOLECYSTECTOMY  11/2017  ? HERNIA REPAIR    ? KNEE SURGERY    ? laparoscopy    ? LEEP    ? TUBAL LIGATION Bilateral 2009  ? ? ?Social History  ? ?Tobacco Use  ? Smoking status: Never  ? Smokeless tobacco: Never  ?Vaping Use  ? Vaping Use: Never used  ?Substance Use Topics  ? Alcohol use: Yes  ?  Comment: occas  ? Drug use: No  ? ? ? ?Medication list has been reviewed and updated. ? ?Current  Meds  ?Medication Sig  ? Calcium Carbonate-Vitamin D (CALCIUM 500 + D PO) Take by mouth. 600+D  ? fluticasone (FLONASE) 50 MCG/ACT nasal spray Place into the nose.  ? hydrochlorothiazide (HYDRODIURIL) 25 MG tablet TAKE 1 TABLET(25 MG) BY MOUTH DAILY  ? irbesartan (AVAPRO) 300 MG tablet TAKE 1 TABLET(300 MG) BY MOUTH DAILY  ? loratadine (CLARITIN) 10 MG tablet Take 10 mg by mouth daily.  ? Multiple Vitamin (MULTIVITAMIN) capsule Take 1 capsule by mouth daily.  ? UNABLE TO FIND Med Name: Michae Kava gummies  ? ? ? ?  03/04/2022  ?  9:29 AM 01/16/2021  ? 10:16 AM 05/14/2020  ?  4:14 PM 03/27/2020  ?  1:28 PM  ?GAD 7 : Generalized Anxiety Score  ?Nervous, Anxious, on Edge 1 0 3 1  ?Control/stop worrying 0 0 3 1  ?Worry too much - different things 0 0 0 1  ?Trouble relaxing 0 0 0 0  ?Restless 0 0 0 0  ?Easily annoyed or irritable 0 0 3 0  ?Afraid - awful might happen 0 0 1 0  ?Total GAD 7 Score 1 0 10 3  ?Anxiety Difficulty Somewhat difficult  Somewhat difficult Not difficult at all  ? ? ? ?  03/04/2022  ?  9:29 AM  ?Depression screen PHQ 2/9  ?Decreased Interest 0  ?Down, Depressed, Hopeless 0  ?PHQ - 2 Score 0  ?Altered sleeping 0  ?Tired, decreased energy 0  ?Change in appetite 0  ?Feeling bad or failure about yourself  0  ?Trouble concentrating 0  ?Moving slowly or fidgety/restless 0  ?Suicidal thoughts 0  ?PHQ-9 Score 0  ?Difficult doing work/chores Not difficult at all  ? ? ?BP Readings from Last 3 Encounters:  ?03/04/22 100/68  ?12/07/21 126/78  ?01/16/21 132/88  ? ? ?Physical Exam ?Vitals and nursing note reviewed.  ?Constitutional:   ?   General: She is not in acute distress. ?   Appearance: She is well-developed.  ?HENT:  ?   Head: Normocephalic and atraumatic.  ?   Right Ear: Tympanic membrane and ear canal normal.  ?   Left Ear: Tympanic membrane and ear canal normal.  ?   Nose:  ?   Right Sinus: No maxillary sinus tenderness.  ?   Left Sinus: No maxillary sinus tenderness.  ?Eyes:  ?   General: No scleral icterus.    ?    Right eye: No discharge.     ?   Left eye: No discharge.  ?   Conjunctiva/sclera: Conjunctivae normal.  ?Neck:  ?   Thyroid: No thyromegaly.  ?   Vascular: No carotid bruit.  ?Cardiovascular:  ?   Rate and Rhythm: Normal rate and regular rhythm.  ?   Pulses: Normal pulses.  ?   Heart sounds: Normal heart sounds.  ?Pulmonary:  ?   Effort: Pulmonary effort is normal. No respiratory distress.  ?  Breath sounds: No wheezing.  ?Chest:  ?Breasts: ?   Right: No mass, nipple discharge, skin change or tenderness.  ?   Left: No mass, nipple discharge, skin change or tenderness.  ?Abdominal:  ?   General: Bowel sounds are normal.  ?   Palpations: Abdomen is soft.  ?   Tenderness: There is no abdominal tenderness.  ?Genitourinary: ?   Labia:     ?   Right: No tenderness, lesion or injury.     ?   Left: No tenderness, lesion or injury.   ?   Vagina: Normal.  ?   Cervix: Normal.  ?   Uterus: Normal.   ?   Adnexa: Right adnexa normal and left adnexa normal.  ?Musculoskeletal:  ?   Cervical back: Normal range of motion. No erythema.  ?   Right lower leg: No edema.  ?   Left lower leg: No edema.  ?Lymphadenopathy:  ?   Cervical: No cervical adenopathy.  ?Skin: ?   General: Skin is warm and dry.  ?   Findings: No rash.  ?Neurological:  ?   Mental Status: She is alert and oriented to person, place, and time.  ?   Cranial Nerves: No cranial nerve deficit.  ?   Sensory: No sensory deficit.  ?   Deep Tendon Reflexes: Reflexes are normal and symmetric.  ?Psychiatric:     ?   Attention and Perception: Attention normal.     ?   Mood and Affect: Mood normal.  ? ? ?Wt Readings from Last 3 Encounters:  ?03/04/22 171 lb 3.2 oz (77.7 kg)  ?12/07/21 169 lb (76.7 kg)  ?01/16/21 173 lb (78.5 kg)  ? ? ?BP 100/68   Pulse 95   Ht _0  (1.575 m)   Wt 171 lb 3.2 oz (77.7 kg)   SpO2 97%   BMI 31.31 kg/m?  ? ?Assessment and Plan: ?1. Annual physical exam ?Exam is normal except for weight. Encourage regular exercise and appropriate dietary  changes. ? ?2. Encounter for screening mammogram for breast cancer ?Schedule Mammogram at [place] through the [facility] ? ?3. Colon cancer screening ?Due for screening but will defer for now due to lack of insurance

## 2022-03-04 NOTE — Telephone Encounter (Signed)
Called pt and left VM informing patients Mother - Andrea Massey Rx for Atorvastatin was sent to pharmacy. ?

## 2022-03-06 LAB — CYTOLOGY - PAP
Comment: NEGATIVE
Diagnosis: NEGATIVE
High risk HPV: NEGATIVE

## 2022-03-13 ENCOUNTER — Telehealth: Payer: Self-pay

## 2022-03-13 NOTE — Telephone Encounter (Signed)
Called pt as a reminder to get labs done. Left VM. ? ?KP ?

## 2022-03-14 ENCOUNTER — Other Ambulatory Visit: Payer: Self-pay | Admitting: Internal Medicine

## 2022-03-14 DIAGNOSIS — I10 Essential (primary) hypertension: Secondary | ICD-10-CM

## 2022-03-14 NOTE — Telephone Encounter (Signed)
Requested medication (s) are due for refill today: Yes ? ?Requested medication (s) are on the active medication list: Yes ? ?Last refill:  09/10/21 ? ?Future visit scheduled: Yes ? ?Notes to clinic:  Protocol indicates lab work is needed. ? ? ? ?Requested Prescriptions  ?Pending Prescriptions Disp Refills  ? hydrochlorothiazide (HYDRODIURIL) 25 MG tablet [Pharmacy Med Name: HYDROCHLOROTHIAZIDE 25MG  TABLETS] 90 tablet 1  ?  Sig: TAKE 1 TABLET(25 MG) BY MOUTH DAILY  ?  ? Cardiovascular: Diuretics - Thiazide Failed - 03/14/2022 12:54 PM  ?  ?  Failed - Cr in normal range and within 180 days  ?  Creatinine  ?Date Value Ref Range Status  ?09/27/2012 0.72 0.60 - 1.30 mg/dL Final  ? ?Creatinine, Ser  ?Date Value Ref Range Status  ?01/16/2021 0.86 0.57 - 1.00 mg/dL Final  ?  ?  ?  ?  Failed - K in normal range and within 180 days  ?  Potassium  ?Date Value Ref Range Status  ?01/16/2021 4.1 3.5 - 5.2 mmol/L Final  ?09/27/2012 3.8 3.5 - 5.1 mmol/L Final  ?  ?  ?  ?  Failed - Na in normal range and within 180 days  ?  Sodium  ?Date Value Ref Range Status  ?01/16/2021 136 134 - 144 mmol/L Final  ?09/27/2012 138 136 - 145 mmol/L Final  ?  ?  ?  ?  Passed - Last BP in normal range  ?  BP Readings from Last 1 Encounters:  ?03/04/22 100/68  ?  ?  ?  ?  Passed - Valid encounter within last 6 months  ?  Recent Outpatient Visits   ? ?      ? 1 week ago Annual physical exam  ? [facility], MD  ? 1 year ago Annual physical exam  ? [facility], MD  ? 1 year ago Essential hypertension  ? [facility], MD  ? 1 year ago Essential hypertension  ? [facility], [facility]  ? 1 year ago Essential hypertension  ? [facility], MD  ? ?  ?  ?Future Appointments   ? ?        ? In 12 months [name], MD [facility]  ? ?  ? ? ?  ?  ?  ? ?

## 2022-03-18 LAB — COMPREHENSIVE METABOLIC PANEL
ALT: 12 IU/L (ref 0–32)
AST: 18 IU/L (ref 0–40)
Albumin/Globulin Ratio: 1.6 (ref 1.2–2.2)
Albumin: 4.7 g/dL (ref 3.8–4.9)
Alkaline Phosphatase: 100 IU/L (ref 44–121)
BUN/Creatinine Ratio: 18 (ref 9–23)
BUN: 16 mg/dL (ref 6–24)
Bilirubin Total: 0.3 mg/dL (ref 0.0–1.2)
CO2: 26 mmol/L (ref 20–29)
Calcium: 10.3 mg/dL — ABNORMAL HIGH (ref 8.7–10.2)
Chloride: 99 mmol/L (ref 96–106)
Creatinine, Ser: 0.89 mg/dL (ref 0.57–1.00)
Globulin, Total: 2.9 g/dL (ref 1.5–4.5)
Glucose: 90 mg/dL (ref 70–99)
Potassium: 4.1 mmol/L (ref 3.5–5.2)
Sodium: 144 mmol/L (ref 134–144)
Total Protein: 7.6 g/dL (ref 6.0–8.5)
eGFR: 78 mL/min/{1.73_m2} (ref >59)

## 2022-03-28 ENCOUNTER — Encounter: Payer: Self-pay | Admitting: Internal Medicine

## 2022-03-31 ENCOUNTER — Encounter: Payer: Self-pay | Admitting: Internal Medicine

## 2022-03-31 ENCOUNTER — Other Ambulatory Visit: Payer: Self-pay | Admitting: Internal Medicine

## 2022-03-31 ENCOUNTER — Ambulatory Visit (INDEPENDENT_AMBULATORY_CARE_PROVIDER_SITE_OTHER): Payer: Self-pay | Admitting: Internal Medicine

## 2022-03-31 VITALS — BP 122/70 | HR 85 | Ht 62.0 in | Wt 171.2 lb

## 2022-03-31 DIAGNOSIS — I10 Essential (primary) hypertension: Secondary | ICD-10-CM

## 2022-03-31 MED ORDER — HYDROCHLOROTHIAZIDE 25 MG PO TABS: ORAL_TABLET | ORAL | 1 refills | Status: DC

## 2022-03-31 NOTE — Patient Instructions (Signed)
Start with HCTZ alone. ?Check BP every day and record.  Monitor for adverse symptoms. ? ?Send me a message in MyChart after 2 weeks to let me know the average readings. ?At that time, I will decide if we need to add another med or a lower dose of irbesartan. ? ? ?

## 2022-03-31 NOTE — Progress Notes (Signed)
? ? ?Date:  03/31/2022  ? ?Name:  Andrea Massey   DOB:  12/19/1970   MRN:  [id number] ? ? ?Chief Complaint: Hypotension ? ?Hypertension ?Pertinent negatives include no chest pain, headaches, palpitations or shortness of breath.  ?She had an episode of passing out with food poisoning several days ago. EMS came out and EKG, blood sugar and BP were acceptable.  She did not go to the ED.  She did not sustain any injury in the fall. ? She has been checking her BP and it is running low.  She feels lightheaded at times but has not checked her BP during those symptoms.  This occurred before the food poisoning. ?Currently feels well today.  Earlier BP 112/78.  She did not take her medications today. ?She is hydrating well, resuming a normal diet.  There has been no overall change in her diet, sodium intake, etc. ? ?Lab Results  ?Component Value Date  ? NA 144 03/17/2022  ? K 4.1 03/17/2022  ? CO2 26 03/17/2022  ? GLUCOSE 90 03/17/2022  ? BUN 16 03/17/2022  ? CREATININE 0.89 03/17/2022  ? CALCIUM 10.3 (H) 03/17/2022  ? EGFR 78 03/17/2022  ? GFRNONAA >60 03/28/2020  ? ?Lab Results  ?Component Value Date  ? CHOL 233 (H) 01/16/2021  ? HDL 83 01/16/2021  ? LDLCALC 141 (H) 01/16/2021  ? TRIG 53 01/16/2021  ? CHOLHDL 2.8 01/16/2021  ? ?Lab Results  ?Component Value Date  ? TSH 2.240 01/16/2021  ? ?Lab Results  ?Component Value Date  ? HGBA1C 5.6 06/14/2018  ? ?Lab Results  ?Component Value Date  ? WBC 10.3 01/16/2021  ? HGB 11.5 01/16/2021  ? HCT 34.1 01/16/2021  ? MCV 84 01/16/2021  ? PLT 338 01/16/2021  ? ?Lab Results  ?Component Value Date  ? ALT 12 03/17/2022  ? AST 18 03/17/2022  ? ALKPHOS 100 03/17/2022  ? BILITOT 0.3 03/17/2022  ? ?No results found for: 25OHVITD2, [place], VD25OH  ? ?Review of Systems  ?Constitutional:  Negative for fatigue and unexpected weight change.  ?HENT:  Negative for nosebleeds.   ?Eyes:  Negative for visual disturbance.  ?Respiratory:  Negative for cough, chest tightness, shortness of  breath and wheezing.   ?Cardiovascular:  Negative for chest pain, palpitations and leg swelling.  ?Gastrointestinal:  Negative for abdominal pain, constipation and diarrhea.  ?Neurological:  Negative for dizziness, weakness, light-headedness and headaches.  ?Psychiatric/Behavioral:  Negative for dysphoric mood and sleep disturbance. The patient is not nervous/anxious.   ? ?Patient Active Problem List  ? Diagnosis Date Noted  ? Anxiety disorder 05/14/2020  ? Essential hypertension 08/31/2019  ? Varicose veins of right leg with edema 08/31/2019  ? Osteopenia 06/10/2018  ? 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 Endometriosis 06/03/2016  ? ? ?Allergies  ?Allergen Reactions  ? Amoxicillin Hives  ? Penicillins Other (See Comments)  ?  Unknown reaction  ? ? ?Past Surgical History:  ?Procedure Laterality Date  ? CHOLECYSTECTOMY  11/2017  ? HERNIA REPAIR    ? KNEE SURGERY    ? laparoscopy    ? LEEP    ? TUBAL LIGATION Bilateral 2009  ? ? ?Social History  ? ?Tobacco Use  ? Smoking status: Never  ? Smokeless tobacco: Never  ?Vaping Use  ? Vaping Use: Never used  ?Substance Use Topics  ? Alcohol use: Yes  ?  Comment: occas  ? Drug use: No  ? ? ? ?Medication list has been reviewed and updated. ? ?Current Meds  ?  Medication Sig  ? Calcium Carbonate-Vitamin D (CALCIUM 500 + D PO) Take by mouth. 600+D  ? fluticasone (FLONASE) 50 MCG/ACT nasal spray Place into the nose.  ? hydrochlorothiazide (HYDRODIURIL) 25 MG tablet TAKE 1 TABLET(25 MG) BY MOUTH DAILY  ? irbesartan (AVAPRO) 300 MG tablet TAKE 1 TABLET(300 MG) BY MOUTH DAILY  ? loratadine (CLARITIN) 10 MG tablet Take 10 mg by mouth daily.  ? Multiple Vitamin (MULTIVITAMIN) capsule Take 1 capsule by mouth daily.  ? UNABLE TO FIND Med Name: Michae Kava gummies  ? ? ? ?  03/31/2022  ?  1:31 PM 03/04/2022  ?  9:29 AM 01/16/2021  ? 10:16 AM 05/14/2020  ?  4:14 PM  ?GAD 7 : Generalized Anxiety Score  ?Nervous, Anxious, on Edge 0 1 0 3  ?Control/stop worrying 0 0 0 3  ?Worry too much - different things 0 0 0 0   ?Trouble relaxing 0 0 0 0  ?Restless 0 0 0 0  ?Easily annoyed or irritable 0 0 0 3  ?Afraid - awful might happen 0 0 0 1  ?Total GAD 7 Score 0 1 0 10  ?Anxiety Difficulty Somewhat difficult Somewhat difficult  Somewhat difficult  ? ? ? ?  03/31/2022  ?  1:31 PM  ?Depression screen PHQ 2/9  ?Decreased Interest 0  ?Down, Depressed, Hopeless 0  ?PHQ - 2 Score 0  ?Altered sleeping 0  ?Tired, decreased energy 0  ?Change in appetite 0  ?Feeling bad or failure about yourself  0  ?Trouble concentrating 0  ?Moving slowly or fidgety/restless 0  ?Suicidal thoughts 0  ?PHQ-9 Score 0  ?Difficult doing work/chores Not difficult at all  ? ? ?BP Readings from Last 3 Encounters:  ?03/31/22 122/70  ?03/04/22 100/68  ?12/07/21 126/78  ? ? ?Physical Exam ?Vitals and nursing note reviewed.  ?Constitutional:   ?   General: She is not in acute distress. ?   Appearance: She is well-developed.  ?HENT:  ?   Head: Normocephalic and atraumatic.  ?Cardiovascular:  ?   Rate and Rhythm: Normal rate and regular rhythm.  ?   Pulses: Normal pulses.  ?   Heart sounds: No murmur heard. ?Pulmonary:  ?   Effort: Pulmonary effort is normal. No respiratory distress.  ?   Breath sounds: No wheezing or rhonchi.  ?Musculoskeletal:  ?   Cervical back: Normal range of motion.  ?   Right lower leg: No edema.  ?   Left lower leg: No edema.  ?Skin: ?   General: Skin is warm and dry.  ?   Findings: No rash.  ?Neurological:  ?   General: No focal deficit present.  ?   Mental Status: She is alert and oriented to person, place, and time.  ?Psychiatric:     ?   Mood and Affect: Mood normal.     ?   Behavior: Behavior normal.  ? ? ?Wt Readings from Last 3 Encounters:  ?03/31/22 171 lb 3.2 oz (77.7 kg)  ?03/04/22 171 lb 3.2 oz (77.7 kg)  ?12/07/21 169 lb (76.7 kg)  ? ? ?BP 122/70   Pulse 85   Ht 5' 2" (1.575 m)   Wt 171 lb 3.2 oz (77.7 kg)   SpO2 98%   BMI 31.31 kg/m?  ? ?Assessment and Plan: ?1. Essential hypertension ?BP have been lower than normal recently and  along with mild dehydration + stomach cramps this led to syncope. ?Recommend holding irbesartan for now - continue HCTZ alone. ?She  will monitor BP daily and message in 2 weeks to report and to get advise on additional medications. ?- hydrochlorothiazide (HYDRODIURIL) 25 MG tablet; TAKE 1 TABLET(25 MG) BY MOUTH DAILY  Dispense: 90 tablet; Refill: 1 ? ? ?Partially dictated using Editor, commissioning. Any errors are unintentional. ? ?Halina Maidens, MD ?[facility] ?[facility] ? ?03/31/2022 ? ? ? ? ? ?

## 2022-04-09 ENCOUNTER — Encounter: Payer: Self-pay | Admitting: Internal Medicine

## 2022-04-25 ENCOUNTER — Other Ambulatory Visit: Payer: Self-pay | Admitting: Internal Medicine

## 2022-04-25 DIAGNOSIS — I10 Essential (primary) hypertension: Secondary | ICD-10-CM

## 2022-04-25 NOTE — Telephone Encounter (Signed)
Requested medication (s) are due for refill today - no  Requested medication (s) are on the active medication list -no  Future visit scheduled -yes  Last refill: unsure  Notes to clinic: medication no longer on current medication list  Requested Prescriptions  Pending Prescriptions Disp Refills   irbesartan (AVAPRO) 300 MG tablet [Pharmacy Med Name: IRBESARTAN 300MG  TABLETS] 90 tablet 0    Sig: TAKE 1 TABLET(300 MG) BY MOUTH DAILY     Cardiovascular:  Angiotensin Receptor Blockers Passed - 04/25/2022  3:36 AM      Passed - Cr in normal range and within 180 days    Creatinine  Date Value Ref Range Status  09/27/2012 0.72 0.60 - 1.30 mg/dL Final   Creatinine, Ser  Date Value Ref Range Status  03/17/2022 0.89 0.57 - 1.00 mg/dL Final         Passed - K in normal range and within 180 days    Potassium  Date Value Ref Range Status  03/17/2022 4.1 3.5 - 5.2 mmol/L Final  09/27/2012 3.8 3.5 - 5.1 mmol/L Final         Passed - Patient is not pregnant      Passed - Last BP in normal range    BP Readings from Last 1 Encounters:  03/31/22 122/70         Passed - Valid encounter within last 6 months    Recent Outpatient Visits           3 weeks ago Essential hypertension   [facility] Glean Hess, MD   1 month ago Annual physical exam   [facility] Glean Hess, MD   1 year ago Annual physical exam   [facility] Glean Hess, MD   1 year ago Essential hypertension   [facility] Glean Hess, MD   1 year ago Essential hypertension   [place], Laura H, MD       Future Appointments             In 10 months Glean Hess, MD [facility]               Requested Prescriptions  Pending Prescriptions Disp Refills   irbesartan (AVAPRO) 300 MG tablet [Pharmacy Med Name: IRBESARTAN 300MG  TABLETS] 90 tablet 0    Sig: TAKE 1 TABLET(300 MG) BY MOUTH DAILY      Cardiovascular:  Angiotensin Receptor Blockers Passed - 04/25/2022  3:36 AM      Passed - Cr in normal range and within 180 days    Creatinine  Date Value Ref Range Status  09/27/2012 0.72 0.60 - 1.30 mg/dL Final   Creatinine, Ser  Date Value Ref Range Status  03/17/2022 0.89 0.57 - 1.00 mg/dL Final         Passed - K in normal range and within 180 days    Potassium  Date Value Ref Range Status  03/17/2022 4.1 3.5 - 5.2 mmol/L Final  09/27/2012 3.8 3.5 - 5.1 mmol/L Final         Passed - Patient is not pregnant      Passed - Last BP in normal range    BP Readings from Last 1 Encounters:  03/31/22 122/70         Passed - Valid encounter within last 6 months    Recent Outpatient Visits           3 weeks ago Essential  hypertension   [facility] Glean Hess, MD   1 month ago Annual physical exam   [facility] Glean Hess, MD   1 year ago Annual physical exam   [facility] Glean Hess, MD   1 year ago Essential hypertension   [facility] Glean Hess, MD   1 year ago Essential hypertension   [facility] Glean Hess, MD       Future Appointments             In 10 months Army Melia Jesse Sans, MD [facility], [facility]

## 2022-05-22 ENCOUNTER — Ambulatory Visit
Admission: EM | Admit: 2022-05-22 | Discharge: 2022-05-22 | Disposition: A | Discharge status: Home / Self Care | Payer: Self-pay | Attending: Emergency Medicine | Admitting: Emergency Medicine

## 2022-05-22 ENCOUNTER — Other Ambulatory Visit: Payer: Self-pay

## 2022-05-22 ENCOUNTER — Encounter: Payer: Self-pay | Admitting: Emergency Medicine

## 2022-05-22 DIAGNOSIS — R051 Acute cough: Secondary | ICD-10-CM

## 2022-05-22 DIAGNOSIS — J069 Acute upper respiratory infection, unspecified: Secondary | ICD-10-CM

## 2022-05-22 MED ORDER — IPRATROPIUM BROMIDE 0.06 % NA SOLN: 2.0000 | Freq: Four times a day (QID) | NASAL | 12 refills | Status: DC

## 2022-05-22 MED ORDER — BENZONATATE 100 MG PO CAPS: 200.0000 mg | ORAL_CAPSULE | Freq: Three times a day (TID) | ORAL | 0 refills | Status: DC

## 2022-05-22 MED ORDER — PROMETHAZINE-DM 6.25-15 MG/5ML PO SYRP: 5.0000 mL | ORAL_SOLUTION | Freq: Four times a day (QID) | ORAL | 0 refills | Status: DC | PRN

## 2022-05-22 MED ORDER — DOXYCYCLINE HYCLATE 100 MG PO CAPS: 100.0000 mg | ORAL_CAPSULE | Freq: Two times a day (BID) | ORAL | 0 refills | Status: DC

## 2022-05-22 NOTE — ED Triage Notes (Signed)
Pt c/o cough, wheezing. Started about 3 weeks ago. Denies fever.

## 2022-05-22 NOTE — ED Provider Notes (Signed)
[facility]    CSN: [id number] Arrival date & time: 05/22/22  1553      History   Chief Complaint Chief Complaint  Patient presents with   Cough    HPI Andrea Massey is a 51 y.o. female.   HPI  51 year old female here for evaluation of respiratory complaints.  Patient reports that for last 3 weeks she has been experiencing cough that is intermittently productive for clear sputum and intermittent wheezing.  She also endorses runny nose, nasal congestion, and right ear pain.  She denies fever or sore throat.  No shortness of breath.  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 Authorizing Provider  benzonatate (TESSALON) 100 MG capsule Take 2 capsules (200 mg total) by mouth every 8 (eight) hours. 05/22/22  Yes Becky Augusta, NP  Calcium Carbonate-Vitamin D (CALCIUM 500 + D PO) Take by mouth. 600+D   Yes [provider]  doxycycline (VIBRAMYCIN) 100 MG capsule Take 1 capsule (100 mg total) by mouth 2 (two) times daily. 05/22/22  Yes Becky Augusta, NP  fluticasone (FLONASE) 50 MCG/ACT nasal spray Place into  the nose. 03/02/08  Yes [provider]  ipratropium (ATROVENT) 0.06 % nasal spray Place 2 sprays into both nostrils 4 (four) times daily. 05/22/22  Yes Becky Augusta, NP  loratadine (CLARITIN) 10 MG tablet Take 10 mg by mouth daily.   Yes [provider]  Multiple Vitamin (MULTIVITAMIN) capsule Take 1 capsule by mouth daily.   Yes [provider]  promethazine-dextromethorphan (PROMETHAZINE-DM) 6.25-15 MG/5ML syrup Take 5 mLs by mouth 4 (four) times daily as needed. 05/22/22  Yes Becky Augusta, NP  UNABLE TO FIND Med Name: [facility] gummies   Yes [provider]  hydrochlorothiazide (HYDRODIURIL) 25 MG tablet TAKE 1 TABLET(25 MG) BY MOUTH DAILY 03/31/22   [name], MD    Family History Family History  Problem Relation Age of Onset   Cerebral aneurysm Mother    Lung cancer Father    Heart disease Paternal Aunt    Breast cancer Other        paternal great-aunt   Lung cancer Brother    Cancer Neg Hx    Diabetes Neg  Hx     Social History Social History   Tobacco Use   Smoking status: Never   Smokeless tobacco: Never  Vaping Use   Vaping Use: Never used  Substance Use Topics   Alcohol use: Yes    Comment: occas   Drug use: No     Allergies   Amoxicillin and Penicillins   Review of Systems Review of Systems  Constitutional:  Negative for fever.  HENT:  Positive for congestion, ear pain and rhinorrhea. Negative for sore throat.   Respiratory:  Positive for cough and wheezing. Negative for shortness of breath.      Physical Exam Triage Vital Signs ED Triage Vitals  Enc Vitals Group     BP 05/22/22 1651 (!) 133/93     Pulse Rate 05/22/22 1651 76     Resp 05/22/22 1651 18     Temp 05/22/22 1651 98.6 F (37 C)     Temp Source 05/22/22 1651 Oral     SpO2 05/22/22 1651 99 %     Weight 05/22/22 1650 171 lb 4.8 oz (77.7 kg)     Height 05/22/22 1650 5\' 2"  (1.575 m)     Head Circumference --      Peak Flow --      Pain Score 05/22/22  1649 0     Pain Loc --      Pain Edu? --      Excl. in GC? --    No data found.  Updated Vital Signs BP (!) 133/93 (BP Location: Left Arm)   Pulse 76   Temp 98.6 F (37 C) (Oral)   Resp 18   Ht 5\' 2"  (1.575 m)   Wt 171 lb 4.8 oz (77.7 kg)   SpO2 99%   BMI 31.33 kg/m   Visual Acuity Right Eye Distance:   Left Eye Distance:   Bilateral Distance:    Right Eye Near:   Left Eye Near:    Bilateral Near:     Physical Exam Vitals reviewed.  Constitutional:      Appearance: Normal appearance. She is not ill-appearing.  HENT:     Head: Normocephalic and atraumatic.     Right Ear: Tympanic membrane, ear canal and external ear normal. There is no impacted cerumen.     Left Ear: Tympanic membrane, ear canal and external ear normal. There is no impacted cerumen.     Nose: Congestion and rhinorrhea present.     Mouth/Throat:     Mouth: Mucous membranes are moist.     Pharynx: Oropharynx is clear. Posterior oropharyngeal erythema present. No oropharyngeal exudate.  Cardiovascular:     Rate and Rhythm: Normal rate and regular rhythm.     Pulses: Normal pulses.     Heart sounds: Normal heart sounds. No murmur heard.    No friction rub. No gallop.  Pulmonary:     Effort: Pulmonary effort is normal.     Breath sounds: Normal breath sounds. No wheezing, rhonchi or rales.  Musculoskeletal:     Cervical back: Normal range of motion and neck supple.  Lymphadenopathy:     Cervical: No cervical adenopathy.  Skin:    General: Skin is warm and dry.     Capillary Refill: Capillary refill takes less than 2 seconds.     Findings: No erythema or rash.  Neurological:     General: No focal deficit present.     Mental Status: She is alert and oriented to person, place, and time.  Psychiatric:  Mood and Affect: Mood normal.        Behavior: Behavior normal.        Thought Content: Thought content normal.        Judgment: Judgment normal.      UC Treatments / Results  Labs (all  labs ordered are listed, but only abnormal results are displayed) Labs Reviewed - No data to display  EKG   Radiology No results found.  Procedures Procedures (including critical care time)  Medications Ordered in UC Medications - No data to display  Initial Impression / Assessment and Plan / UC Course  I have reviewed the triage vital signs and the nursing notes.  Pertinent labs & imaging results that were available during my care of the patient were reviewed by me and considered in my medical decision making (see chart for details).  Patient is a very pleasant, nontoxic-appearing 51 year old female here for evaluation of three fourth of respiratory complaints to include runny nose, nasal congestion, cough, and wheezing.  She denies any fever, or sore throat.  Also no shortness of breath.  Physical exam reveals pearly-gray tympanic membranes bilaterally with normal light reflex and clear external auditory canals.  Nasal mucosa is erythematous and edematous with clear discharge in both nares.  Oropharyngeal exam reveals posterior oropharyngeal erythema with clear postnasal drip.  No cervical of adenopathy appreciable exam.  Cardiopulmonary exam reveals S1-S2 heart sounds with regular rate and rhythm and lung sounds are clear to auscultation all fields.  Patient exam is consistent with an upper respiratory infection and believe the postnasal drip is what is feeding her cough.  Given that she has had symptoms for 3 weeks I feel that a trial of antibiotics is warranted even though she is not having any purulent nasal discharge or sputum production.  She has an allergy to penicillin so I placed her on doxycycline twice daily for 10 days.  I will also prescribe Atrovent nasal spray to help with nasal congestion and postnasal drip, Tessalon Perles, and Promethazine DM cough syrup to help with cough and congestion.  Return precautions reviewed.   Final Clinical Impressions(s) / UC Diagnoses   Final  diagnoses:  Upper respiratory tract infection, unspecified type  Acute cough     Discharge Instructions      Take the Doxycycline twice daily for 10 days with food for treatment of your URI.   Use the Atrovent nasal spray, 2 squirts in each nostril every 6 hours, as needed for runny nose and postnasal drip.  Use the Tessalon Perles every 8 hours during the day.  Take them with a small sip of water.  They may give you some numbness to the base of your tongue or a metallic taste in your mouth, this is normal.  Use the Promethazine DM cough syrup at bedtime for cough and congestion.  It will make you drowsy so do not take it during the day.  Return for reevaluation or see your primary care provider for any new or worsening symptoms.      ED Prescriptions     Medication Sig Dispense Auth. Provider   benzonatate (TESSALON) 100 MG capsule Take 2 capsules (200 mg total) by mouth every 8 (eight) hours. 21 capsule Becky Augusta, NP   doxycycline (VIBRAMYCIN) 100 MG capsule Take 1 capsule (100 mg total) by mouth 2 (two) times daily. 20 capsule Becky Augusta, NP   ipratropium (ATROVENT) 0.06 % nasal spray Place 2 sprays into both nostrils 4 (four) times daily. 15  mL Becky Augusta, NP   promethazine-dextromethorphan (PROMETHAZINE-DM) 6.25-15 MG/5ML syrup Take 5 mLs by mouth 4 (four) times daily as needed. 118 mL Becky Augusta, NP      PDMP not reviewed this encounter.   Becky Augusta, NP 05/22/22 1723

## 2022-05-22 NOTE — Discharge Instructions (Signed)
Take the Doxycycline twice daily for 10 days with food for treatment of your URI.   Use the Atrovent nasal spray, 2 squirts in each nostril every 6 hours, as needed for runny nose and postnasal drip.  Use the Tessalon Perles every 8 hours during the day.  Take them with a small sip of water.  They may give you some numbness to the base of your tongue or a metallic taste in your mouth, this is normal.  Use the Promethazine DM cough syrup at bedtime for cough and congestion.  It will make you drowsy so do not take it during the day.  Return for reevaluation or see your primary care provider for any new or worsening symptoms.

## 2022-10-05 ENCOUNTER — Other Ambulatory Visit: Payer: Self-pay | Admitting: Internal Medicine

## 2022-10-05 DIAGNOSIS — I10 Essential (primary) hypertension: Secondary | ICD-10-CM

## 2022-10-06 NOTE — Telephone Encounter (Signed)
Requested medication (s) are due for refill today: yes  Requested medication (s) are on the active medication list: yes  Last refill:  03/31/22 #90 1 RF  Future visit scheduled: in 4 months  Notes to clinic:  called pt and LM on VM to call back to office to schedule OV/labs   Requested Prescriptions  Pending Prescriptions Disp Refills   hydrochlorothiazide (HYDRODIURIL) 25 MG tablet [Pharmacy Med Name: HYDROCHLOROTHIAZIDE 25MG  TABLETS] 90 tablet 1    Sig: TAKE 1 TABLET(25 MG) BY MOUTH DAILY     Cardiovascular: Diuretics - Thiazide Failed - 10/05/2022 10:15 AM      Failed - Cr in normal range and within 180 days    Creatinine  Date Value Ref Range Status  09/27/2012 0.72 0.60 - 1.30 mg/dL Final   Creatinine, Ser  Date Value Ref Range Status  03/17/2022 0.89 0.57 - 1.00 mg/dL Final         Failed - K in normal range and within 180 days    Potassium  Date Value Ref Range Status  03/17/2022 4.1 3.5 - 5.2 mmol/L Final  09/27/2012 3.8 3.5 - 5.1 mmol/L Final         Failed - Na in normal range and within 180 days    Sodium  Date Value Ref Range Status  03/17/2022 144 134 - 144 mmol/L Final  09/27/2012 138 136 - 145 mmol/L Final         Failed - Last BP in normal range    BP Readings from Last 1 Encounters:  05/22/22 (!) 133/93         Failed - Valid encounter within last 6 months    Recent Outpatient Visits           6 months ago Essential hypertension   [place] [facility] at [facility], [facility], MD   7 months ago Annual physical exam   [place] [facility] at [facility], [facility], MD   1 year ago Annual physical exam   [place] [facility] at [facility], [facility], MD   2 years ago Essential hypertension   [place] [facility] at [facility], [facility], MD   2 years ago Essential hypertension    [facility] Medicine at [facility], [facility] [facility], MD       Future Appointments             In 5 months Nyoka Cowden, Judithann Graves, MD [facility] at [facility], [facility]

## 2022-11-24 ENCOUNTER — Encounter: Payer: Self-pay | Admitting: Internal Medicine

## 2022-11-25 ENCOUNTER — Encounter: Payer: Self-pay | Admitting: Internal Medicine

## 2022-11-25 ENCOUNTER — Ambulatory Visit (INDEPENDENT_AMBULATORY_CARE_PROVIDER_SITE_OTHER): Payer: Medicaid Other | Admitting: Internal Medicine

## 2022-11-25 VITALS — BP 136/72 | HR 85 | Ht 62.0 in | Wt 172.8 lb

## 2022-11-25 DIAGNOSIS — I10 Essential (primary) hypertension: Secondary | ICD-10-CM | POA: Diagnosis not present

## 2022-11-25 DIAGNOSIS — F419 Anxiety disorder, unspecified: Secondary | ICD-10-CM

## 2022-11-25 MED ORDER — HYDROCHLOROTHIAZIDE 25 MG PO TABS: 25.0000 mg | ORAL_TABLET | Freq: Every day | ORAL | 1 refills | Status: DC

## 2022-11-25 MED ORDER — ESCITALOPRAM OXALATE 10 MG PO TABS: 10.0000 mg | ORAL_TABLET | Freq: Every day | ORAL | 1 refills | Status: DC

## 2022-11-25 MED ORDER — IRBESARTAN 75 MG PO TABS: 75.0000 mg | ORAL_TABLET | Freq: Every day | ORAL | 1 refills | Status: DC

## 2022-11-25 NOTE — Patient Instructions (Signed)
Start the Lexapro 1/2 tablet daily for 4 days then 1 tablet daily

## 2022-11-25 NOTE — Assessment & Plan Note (Signed)
Having more symptoms lately due to home and work stress Tearful, irritable and anxious

## 2022-11-25 NOTE — Progress Notes (Signed)
Date:  11/25/2022   Name:  Andrea Massey   DOB:  Apr 29, 1971   MRN:  [id number]   Chief Complaint: Anxiety and Hypertension  Hypertension This is a chronic problem. The problem has been waxing and waning since onset. Associated symptoms include anxiety and palpitations. Pertinent negatives include no chest pain, headaches or shortness of breath. Past treatments include diuretics (hctz but stopped irbesartan in July after syncopal episode with gastroenteritis). There is no history of kidney disease, CAD/MI or CVA.  Anxiety Presents for initial visit. Onset was 1 to 4 weeks ago. The problem has been gradually worsening. Symptoms include excessive worry, irritability, nervous/anxious behavior and palpitations. Patient reports no chest pain, dizziness, shortness of breath or suicidal ideas.   Past treatments include nothing.     Lab Results  Component Value Date   NA 144 03/17/2022   K 4.1 03/17/2022   CO2 26 03/17/2022   GLUCOSE 90 03/17/2022   BUN 16 03/17/2022   CREATININE 0.89 03/17/2022   CALCIUM 10.3 (H) 03/17/2022   EGFR 78 03/17/2022   GFRNONAA >60 03/28/2020   Lab Results  Component Value Date   CHOL 233 (H) 01/16/2021   HDL 83 01/16/2021   LDLCALC 141 (H) 01/16/2021   TRIG 53 01/16/2021   CHOLHDL 2.8 01/16/2021   Lab Results  Component Value Date   TSH 2.240 01/16/2021   Lab Results  Component Value Date   HGBA1C 5.6 06/14/2018   Lab Results  Component Value Date   WBC 10.3 01/16/2021   HGB 11.5 01/16/2021   HCT 34.1 01/16/2021   MCV 84 01/16/2021   PLT 338 01/16/2021   Lab Results  Component Value Date   ALT 12 03/17/2022   AST 18 03/17/2022   ALKPHOS 100 03/17/2022   BILITOT 0.3 03/17/2022   No results found for: "25OHVITD2", "25OHVITD3", "VD25OH"   Review of Systems  Constitutional:  Positive for irritability. Negative for chills, fatigue and fever.  Respiratory:  Negative for cough, chest tightness and shortness of breath.    Cardiovascular:  Positive for palpitations. Negative for chest pain.  Neurological:  Negative for dizziness, light-headedness and headaches.  Psychiatric/Behavioral:  Negative for dysphoric mood and suicidal ideas. The patient is nervous/anxious.     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  Current Meds  Medication Sig   Calcium Carbonate-Vitamin D (CALCIUM 500 + D PO) Take by mouth. 600+D   escitalopram (LEXAPRO) 10 MG tablet Take 1 tablet (10 mg total) by mouth daily.   fluticasone (FLONASE) 50 MCG/ACT nasal spray Place into the  nose.   ipratropium (ATROVENT) 0.06 % nasal spray Place 2 sprays into both nostrils 4 (four) times daily.   irbesartan (AVAPRO) 75 MG tablet Take 1 tablet (75 mg total) by mouth daily.   loratadine (CLARITIN) 10 MG tablet Take 10 mg by mouth daily.   Multiple Vitamin (MULTIVITAMIN) capsule Take 1 capsule by mouth daily.   UNABLE TO FIND Med Name: [facility]ie gummies   [DISCONTINUED] doxycycline (VIBRAMYCIN) 100 MG capsule Take 1 capsule (100 mg total) by mouth 2 (two) times daily.   [DISCONTINUED] hydrochlorothiazide (HYDRODIURIL) 25 MG tablet TAKE 1 TABLET(25 MG) BY MOUTH DAILY       11/25/2022    9:26 AM 03/31/2022     1:31 PM 03/04/2022    9:29 AM 01/16/2021   10:16 AM  GAD 7 : Generalized Anxiety Score  Nervous, Anxious, on Edge 1 0 1 0  Control/stop worrying 1 0 0 0  Worry too much - different things 1 0 0 0  Trouble relaxing 1 0 0 0  Restless 0 0 0 0  Easily annoyed or irritable 1 0 0 0  Afraid - awful might happen 0 0 0 0  Total GAD 7 Score 5 0 1 0  Anxiety Difficulty Somewhat difficult Somewhat difficult Somewhat difficult        11/25/2022    9:26 AM 03/31/2022    1:31 PM 03/04/2022    9:29 AM  Depression screen PHQ 2/9  Decreased Interest 1 0 0  Down, Depressed, Hopeless 1 0 0  PHQ - 2 Score 2 0 0  Altered sleeping 0 0 0  Tired, decreased energy 1 0 0  Change in appetite 0 0 0  Feeling bad or failure about yourself  0 0 0  Trouble concentrating 1 0 0  Moving slowly or fidgety/restless 0 0 0  Suicidal thoughts 0 0 0  PHQ-9 Score 4 0 0  Difficult doing work/chores Not difficult at all Not difficult at all Not difficult at all    BP Readings from Last 3 Encounters:  11/25/22 136/72  05/22/22 (!) 133/93  03/31/22 122/70    Physical Exam Vitals and nursing note reviewed.  Constitutional:      General: She is not in acute distress.    Appearance: Normal appearance. She is well-developed.  HENT:     Head: Normocephalic and atraumatic.  Cardiovascular:     Rate and Rhythm: Normal rate and regular rhythm.  Pulmonary:     Effort: Pulmonary effort is normal. No respiratory distress.     Breath sounds: No wheezing or rhonchi.  Musculoskeletal:     Cervical back: Normal range of motion.  Skin:    General: Skin is warm and dry.     Capillary Refill: Capillary refill takes less than 2 seconds.     Findings: No rash.  Neurological:     Mental Status: She is alert and oriented to person, place, and time.  Psychiatric:        Mood and Affect: Mood is anxious. Affect is tearful.        Speech: Speech normal.        Behavior: Behavior normal.     Wt Readings from Last 3  Encounters:  11/25/22 172 lb 12.8 oz (78.4 kg)  05/22/22 171 lb 4.8 oz (77.7 kg)  03/31/22 171 lb 3.2 oz (77.7 kg)    BP 136/72   Pulse 85   Ht 5\' 2"  (1.575 m)   Wt 172 lb 12.8 oz (78.4  kg)   SpO2 98%   BMI 31.61 kg/m   Assessment and Plan: Problem List Items Addressed This Visit       Cardiovascular and Mediastinum   Essential hypertension - Primary (Chronic)    BP was low 6 mo ago after gastroenteritis and avapro was held. Now BP are spiking up - she has not been taking hctz daily. She does have a lot more anxiety so will start Avapro 75 mg       Relevant Medications   irbesartan (AVAPRO) 75 MG tablet   hydrochlorothiazide (HYDRODIURIL) 25 MG tablet     Other   Anxiety disorder (Chronic)    Having more symptoms lately due to home and work stress Tearful, irritable and anxious      Relevant Medications   escitalopram (LEXAPRO) 10 MG tablet     Partially dictated using Editor, commissioning. Any errors are unintentional.  Halina Maidens, MD [facility]  11/25/2022

## 2022-11-25 NOTE — Assessment & Plan Note (Addendum)
BP was low 6 mo ago after gastroenteritis and avapro was held. Now BP are spiking up - she has not been taking hctz daily. She does have a lot more anxiety so will start Avapro 75 mg

## 2022-12-26 ENCOUNTER — Ambulatory Visit (INDEPENDENT_AMBULATORY_CARE_PROVIDER_SITE_OTHER): Payer: Medicaid Other | Admitting: Internal Medicine

## 2022-12-26 ENCOUNTER — Encounter: Payer: Self-pay | Admitting: Internal Medicine

## 2022-12-26 VITALS — BP 128/78 | HR 88 | Ht 62.0 in | Wt 172.0 lb

## 2022-12-26 DIAGNOSIS — I10 Essential (primary) hypertension: Secondary | ICD-10-CM

## 2022-12-26 DIAGNOSIS — Z1211 Encounter for screening for malignant neoplasm of colon: Secondary | ICD-10-CM | POA: Diagnosis not present

## 2022-12-26 DIAGNOSIS — F419 Anxiety disorder, unspecified: Secondary | ICD-10-CM | POA: Diagnosis not present

## 2022-12-26 DIAGNOSIS — Z1231 Encounter for screening mammogram for malignant neoplasm of breast: Secondary | ICD-10-CM

## 2022-12-26 NOTE — Patient Instructions (Signed)
Call [facility] to schedule your mammogram at [phone].  

## 2022-12-26 NOTE — Assessment & Plan Note (Signed)
Clinically stable exam with well controlled BP on Avapro and hctz. Tolerating medications without side effects. Pt to continue current regimen and low sodium diet.

## 2022-12-26 NOTE — Progress Notes (Signed)
Date:  12/26/2022   Name:  Andrea Massey   DOB:  1971-07-17   MRN:  [id number]   Chief Complaint: Hypertension and Anxiety  Hypertension This is a chronic problem. The problem is controlled. Associated symptoms include anxiety. Pertinent negatives include no chest pain, headaches, palpitations or shortness of breath. Past treatments include angiotensin blockers and diuretics. The current treatment provides significant improvement.  Anxiety Presents for follow-up visit. Patient reports no chest pain, dizziness, nervous/anxious behavior, palpitations or shortness of breath.   Compliance with medications: started Lexapro last visit.    Lab Results  Component Value Date   NA 144 03/17/2022   K 4.1 03/17/2022   CO2 26 03/17/2022   GLUCOSE 90 03/17/2022   BUN 16 03/17/2022   CREATININE 0.89 03/17/2022   CALCIUM 10.3 (H) 03/17/2022   EGFR 78 03/17/2022   GFRNONAA >60 03/28/2020   Lab Results  Component Value Date   CHOL 233 (H) 01/16/2021   HDL 83 01/16/2021   LDLCALC 141 (H) 01/16/2021   TRIG 53 01/16/2021   CHOLHDL 2.8 01/16/2021   Lab Results  Component Value Date   TSH 2.240 01/16/2021   Lab Results  Component Value Date   HGBA1C 5.6 06/14/2018   Lab Results  Component Value Date   WBC 10.3 01/16/2021   HGB 11.5 01/16/2021   HCT 34.1 01/16/2021   MCV 84 01/16/2021   PLT 338 01/16/2021   Lab Results  Component Value Date   ALT 12 03/17/2022   AST 18 03/17/2022   ALKPHOS 100 03/17/2022   BILITOT 0.3 03/17/2022   No results found for: "25OHVITD2", "25OHVITD3", "VD25OH"   Review of Systems  Constitutional:  Negative for fatigue and unexpected weight change.  HENT:  Negative for nosebleeds.   Eyes:  Negative for visual disturbance.  Respiratory:  Negative for cough, chest tightness, shortness of breath and wheezing.   Cardiovascular:  Negative for chest pain, palpitations and leg swelling.  Gastrointestinal:  Negative for abdominal pain,  constipation and diarrhea.  Genitourinary:  Negative for menstrual problem.  Neurological:  Negative for dizziness, weakness, light-headedness and headaches.  Psychiatric/Behavioral:  Negative for dysphoric mood and sleep disturbance. The patient is not nervous/anxious.     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  Current Meds  Medication Sig   Calcium Carbonate-Vitamin D (CALCIUM 500 + D PO) Take by mouth. 600+D   escitalopram (LEXAPRO) 10 MG tablet Take 1 tablet (10 mg total) by mouth daily.   fluticasone (FLONASE) 50 MCG/ACT nasal spray Place into the nose.   hydrochlorothiazide (  HYDRODIURIL) 25 MG tablet Take 1 tablet (25 mg total) by mouth daily.   ipratropium (ATROVENT) 0.06 % nasal spray Place 2 sprays into both nostrils 4 (four) times daily.   irbesartan (AVAPRO) 75 MG tablet Take 1 tablet (75 mg total) by mouth daily.   loratadine (CLARITIN) 10 MG tablet Take 10 mg by mouth daily as needed.   Multiple Vitamin (MULTIVITAMIN) capsule Take 1 capsule by mouth daily.       12/26/2022    2:56 PM 11/25/2022    9:26 AM 03/31/2022    1:31 PM 03/04/2022    9:29 AM  GAD 7 : Generalized Anxiety Score  Nervous, Anxious, on Edge 0 1 0 1   Control/stop worrying 0 1 0 0  Worry too much - different things 0 1 0 0  Trouble relaxing 0 1 0 0  Restless 0 0 0 0  Easily annoyed or irritable 0 1 0 0  Afraid - awful might happen 0 0 0 0  Total GAD 7 Score 0 5 0 1  Anxiety Difficulty Not difficult at all Somewhat difficult Somewhat difficult Somewhat difficult       12/26/2022    2:56 PM 11/25/2022    9:26 AM 03/31/2022    1:31 PM  Depression screen PHQ 2/9  Decreased Interest 0 1 0  Down, Depressed, Hopeless 0 1 0  PHQ - 2 Score 0 2 0  Altered sleeping 1 0 0  Tired, decreased energy 0 1 0  Change in appetite 0 0 0  Feeling bad or failure about yourself  0 0 0  Trouble concentrating 0 1 0  Moving slowly or fidgety/restless 0 0 0  Suicidal thoughts 0 0 0  PHQ-9 Score 1 4 0  Difficult doing work/chores Not difficult at all Not difficult at all Not difficult at all    BP Readings from Last 3 Encounters:  12/26/22 128/78  11/25/22 136/72  05/22/22 (!) 133/93    Physical Exam Vitals and nursing note reviewed.  Constitutional:      General: She is not in acute distress.    Appearance: She is well-developed.  HENT:     Head: Normocephalic and atraumatic.  Cardiovascular:     Rate and Rhythm: Normal rate and regular rhythm.     Pulses: Normal pulses.  Pulmonary:     Effort: Pulmonary effort is normal. No respiratory distress.     Breath sounds: No wheezing or rhonchi.  Musculoskeletal:     Cervical back: Normal range of motion.     Right lower leg: No edema.     Left lower leg: No edema.  Lymphadenopathy:     Cervical: No cervical adenopathy.  Skin:    General: Skin is warm and dry.     Findings: No rash.  Neurological:     Mental Status: She is alert and oriented to person, place, and time.  Psychiatric:        Mood and Affect: Mood normal.        Behavior: Behavior normal.     Wt Readings from Last 3 Encounters:  12/26/22 172 lb (78 kg)  11/25/22 172 lb 12.8 oz (78.4 kg)  05/22/22 171 lb 4.8 oz (77.7  kg)    BP 128/78   Pulse 88   Ht 5' 2"$  (1.575 m)   Wt 172 lb (78 kg)   SpO2 98%   BMI 31.46 kg/m   Assessment and Plan: Problem List Items Addressed This Visit  Cardiovascular and Mediastinum   Essential hypertension - Primary (Chronic)    Clinically stable exam with well controlled BP on Avapro and hctz. Tolerating medications without side effects. Pt to continue current regimen and low sodium diet.         Other   Anxiety disorder (Chronic)    Lexapro started one month ago for increasing anxiety symptoms  Much improved without medication side effects - still on 5 mg daily. She will continue the same low dose until follow up in April.       Other Visit Diagnoses     Encounter for screening mammogram for breast cancer       Relevant Orders   MM 3D SCREEN BREAST BILATERAL   Colon cancer screening       Relevant Orders   Ambulatory referral to Gastroenterology        Partially dictated using Dragon software. Any errors are unintentional.  Halina Maidens, MD [facility]  12/26/2022

## 2022-12-26 NOTE — Assessment & Plan Note (Addendum)
Lexapro started one month ago for increasing anxiety symptoms  Much improved without medication side effects - still on 5 mg daily. She will continue the same low dose until follow up in April.

## 2023-01-05 ENCOUNTER — Encounter: Payer: Self-pay | Admitting: *Deleted

## 2023-02-15 ENCOUNTER — Encounter: Payer: Self-pay | Admitting: Internal Medicine

## 2023-03-09 ENCOUNTER — Ambulatory Visit (INDEPENDENT_AMBULATORY_CARE_PROVIDER_SITE_OTHER): Payer: Medicaid Other | Admitting: Internal Medicine

## 2023-03-09 ENCOUNTER — Encounter: Payer: Self-pay | Admitting: Internal Medicine

## 2023-03-09 VITALS — BP 120/78 | HR 73 | Ht 62.0 in | Wt 174.0 lb

## 2023-03-09 DIAGNOSIS — Z1159 Encounter for screening for other viral diseases: Secondary | ICD-10-CM

## 2023-03-09 DIAGNOSIS — Z1231 Encounter for screening mammogram for malignant neoplasm of breast: Secondary | ICD-10-CM | POA: Diagnosis not present

## 2023-03-09 DIAGNOSIS — I10 Essential (primary) hypertension: Secondary | ICD-10-CM

## 2023-03-09 DIAGNOSIS — Z0000 Encounter for general adult medical examination without abnormal findings: Secondary | ICD-10-CM | POA: Diagnosis not present

## 2023-03-09 DIAGNOSIS — F419 Anxiety disorder, unspecified: Secondary | ICD-10-CM

## 2023-03-09 DIAGNOSIS — Z1211 Encounter for screening for malignant neoplasm of colon: Secondary | ICD-10-CM

## 2023-03-09 MED ORDER — ESCITALOPRAM OXALATE 10 MG PO TABS: 10.0000 mg | ORAL_TABLET | Freq: Every day | ORAL | 1 refills | Status: DC

## 2023-03-09 NOTE — Patient Instructions (Signed)
Call [facility] to schedule your mammogram at [phone].  Also schedule your colonoscopy.

## 2023-03-09 NOTE — Progress Notes (Signed)
Date:  03/09/2023   Name:  Andrea Massey   DOB:  February 27, 1971   MRN:  [id number]   Chief Complaint: Annual Exam Andrea Massey is a 52 y.o. female who presents today for her Complete Annual Exam. She feels well. She reports exercising- none. She reports she is sleeping well. Breast complaints - none.  Mammogram: 12/2019 - ordered DEXA: none Pap smear: 02/2022 neg/neg Colonoscopy: none  Health Maintenance Due  Topic Date Due   Hepatitis C Screening  Never done   DTaP/Tdap/Td (1 - Tdap) Never done   COLONOSCOPY (Pts 45-62yrs Insurance coverage will need to be confirmed)  Never done   Zoster Vaccines- Shingrix (1 of 2) Never done   MAMMOGRAM  01/03/2021   COVID-19 Vaccine (3 - 2023-24 season) 07/18/2022    Immunization History  Administered Date(s) Administered   Moderna Sars-Covid-2 Vaccination 01/28/2020, 02/29/2020    Hypertension This is a chronic problem. The problem is controlled. Pertinent negatives include no chest pain, headaches, palpitations or shortness of breath. Past treatments include diuretics (stopped avapro due to low readings and symptoms).    Lab Results  Component Value Date   NA 144 03/17/2022   K 4.1 03/17/2022   CO2 26 03/17/2022   GLUCOSE 90 03/17/2022   BUN 16 03/17/2022   CREATININE 0.89 03/17/2022   CALCIUM 10.3 (H) 03/17/2022   EGFR 78 03/17/2022   GFRNONAA >60 03/28/2020   Lab Results  Component Value Date   CHOL 233 (H) 01/16/2021   HDL 83 01/16/2021   LDLCALC 141 (H) 01/16/2021   TRIG 53 01/16/2021   CHOLHDL 2.8 01/16/2021   Lab Results  Component Value Date   TSH 2.240 01/16/2021   Lab Results  Component Value Date   HGBA1C 5.6 06/14/2018   Lab Results  Component Value Date   WBC 10.3 01/16/2021   HGB 11.5 01/16/2021   HCT 34.1 01/16/2021   MCV 84 01/16/2021   PLT 338 01/16/2021   Lab Results  Component Value Date   ALT 12 03/17/2022   AST 18 03/17/2022   ALKPHOS 100 03/17/2022   BILITOT 0.3  03/17/2022   No results found for: "25OHVITD2", "25OHVITD3", "VD25OH"   Review of Systems  Constitutional:  Negative for chills, fatigue and fever.  HENT:  Negative for congestion, hearing loss, tinnitus, trouble swallowing and voice change.   Eyes:  Negative for visual disturbance.  Respiratory:  Negative for cough, chest tightness, shortness of breath and wheezing.   Cardiovascular:  Negative for chest pain, palpitations and leg swelling.  Gastrointestinal:  Negative for abdominal pain, constipation, diarrhea and vomiting.  Endocrine: Negative for polydipsia and polyuria.  Genitourinary:  Negative for dysuria, frequency, genital sores, vaginal bleeding and vaginal discharge.  Musculoskeletal:  Negative for arthralgias, gait problem and joint swelling.  Skin:  Negative for color change and rash.  Neurological:  Negative for dizziness, tremors, light-headedness and headaches.  Hematological:  Negative for adenopathy. Does not bruise/bleed easily.  Psychiatric/Behavioral:  Negative for dysphoric mood and sleep disturbance. The patient is not nervous/anxious.     Patient Active Problem List   Diagnosis Date Noted   Anxiety disorder 05/14/2020   Essential hypertension 08/31/2019   Varicose veins of right leg with edema 08/31/2019   Osteopenia 06/10/2018   Endometriosis 06/03/2016    Allergies  Allergen Reactions   Amoxicillin Hives   Penicillins Other (See Comments)    Unknown reaction    Past Surgical History:  Procedure Laterality Date  CHOLECYSTECTOMY  11/2017   HERNIA REPAIR     KNEE SURGERY     laparoscopy     LEEP     TUBAL LIGATION Bilateral 2009    Social History   Tobacco Use   Smoking status: Never   Smokeless tobacco: Never  Vaping Use   Vaping Use: Never used  Substance Use Topics   Alcohol use: Yes    Comment: occas   Drug use: No     Medication list has been reviewed and updated.  Current Meds  Medication Sig   Calcium Carbonate-Vitamin D  (CALCIUM 500 + D PO) Take by mouth. 600+D   fluticasone (FLONASE) 50 MCG/ACT nasal spray Place into the nose.   hydrochlorothiazide (HYDRODIURIL) 25 MG tablet Take 1 tablet (25 mg total) by mouth daily.   ipratropium (ATROVENT) 0.06 % nasal spray Place 2 sprays into both nostrils 4 (four) times daily.   loratadine (CLARITIN) 10 MG tablet Take 10 mg by mouth daily as needed.   Multiple Vitamin (MULTIVITAMIN) capsule Take 1 capsule by mouth daily.   UNABLE TO FIND Med Name: gollie gummies   [DISCONTINUED] escitalopram (LEXAPRO) 10 MG tablet Take 1 tablet (10 mg total) by mouth daily.   [DISCONTINUED] irbesartan (AVAPRO) 75 MG tablet Take 1 tablet (75 mg total) by mouth daily.       03/09/2023    8:38 AM 12/26/2022    2:56 PM 11/25/2022    9:26 AM 03/31/2022    1:31 PM  GAD 7 : Generalized Anxiety Score  Nervous, Anxious, on Edge 1 0 1 0  Control/stop worrying 0 0 1 0  Worry too much - different things 0 0 1 0  Trouble relaxing 1 0 1 0  Restless 0 0 0 0  Easily annoyed or irritable 0 0 1 0  Afraid - awful might happen 0 0 0 0  Total GAD 7 Score 2 0 5 0  Anxiety Difficulty Not difficult at all Not difficult at all Somewhat difficult Somewhat difficult       03/09/2023    8:38 AM 12/26/2022    2:56 PM 11/25/2022    9:26 AM  Depression screen PHQ 2/9  Decreased Interest 0 0 1  Down, Depressed, Hopeless 0 0 1  PHQ - 2 Score 0 0 2  Altered sleeping 0 1 0  Tired, decreased energy 1 0 1  Change in appetite 0 0 0  Feeling bad or failure about yourself  0 0 0  Trouble concentrating 0 0 1  Moving slowly or fidgety/restless 0 0 0  Suicidal thoughts 0 0 0  PHQ-9 Score 1 1 4   Difficult doing work/chores Not difficult at all Not difficult at all Not difficult at all    BP Readings from Last 3 Encounters:  03/09/23 120/78  12/26/22 128/78  11/25/22 136/72    Physical Exam Vitals and nursing note reviewed.  Constitutional:      General: She is not in acute distress.    Appearance: She is  well-developed.  HENT:     Head: Normocephalic and atraumatic.     Right Ear: Tympanic membrane and ear canal normal.     Left Ear: Tympanic membrane and ear canal normal.     Nose:     Right Sinus: No maxillary sinus tenderness.     Left Sinus: No maxillary sinus tenderness.  Eyes:     General: No scleral icterus.       Right eye: No discharge.  Left eye: No discharge.     Conjunctiva/sclera: Conjunctivae normal.  Neck:     Thyroid: No thyromegaly.     Vascular: No carotid bruit.  Cardiovascular:     Rate and Rhythm: Normal rate and regular rhythm.     Pulses: Normal pulses.     Heart sounds: Normal heart sounds.  Pulmonary:     Effort: Pulmonary effort is normal. No respiratory distress.     Breath sounds: No wheezing.  Chest:  Breasts:    Right: No mass, nipple discharge, skin change or tenderness.     Left: Inverted nipple present. No mass, nipple discharge, skin change or tenderness.  Abdominal:     General: Bowel sounds are normal.     Palpations: Abdomen is soft.     Tenderness: There is no abdominal tenderness.  Musculoskeletal:     Cervical back: Normal range of motion. No erythema.     Right lower leg: No edema.     Left lower leg: No edema.  Lymphadenopathy:     Cervical: No cervical adenopathy.  Skin:    General: Skin is warm and dry.     Findings: No rash.  Neurological:     Mental Status: She is alert and oriented to person, place, and time.     Cranial Nerves: No cranial nerve deficit.     Sensory: No sensory deficit.     Deep Tendon Reflexes: Reflexes are normal and symmetric.  Psychiatric:        Attention and Perception: Attention normal.        Mood and Affect: Mood normal.     Wt Readings from Last 3 Encounters:  03/09/23 174 lb (78.9 kg)  12/26/22 172 lb (78 kg)  11/25/22 172 lb 12.8 oz (78.4 kg)    BP 120/78 (BP Location: Right Arm, Cuff Size: Large)   Pulse 73   Ht  (1.575 m)   Wt 174 lb (78.9 kg)   SpO2 98%   BMI 31.83  kg/m   Assessment and Plan:  Problem List Items Addressed This Visit       Cardiovascular and Mediastinum   Essential hypertension (Chronic)    She has stopped her Avapro due to low BP readings at home. May have felt slightly light headed but no significant symptoms. BP at home have been 125/75 Today 120/78 Will continue HCTZ alone. DASH diet.       Relevant Orders   CBC with Differential/Platelet   Comprehensive metabolic panel   TSH   Urinalysis, Routine w reflex microscopic     Other   Anxiety disorder (Chronic)    Clinically stable on current regimen with good control of symptoms, No SI or HI. No change in management at this time. Lexapro 10 mg.      Relevant Medications   escitalopram (LEXAPRO) 10 MG tablet   Other Visit Diagnoses     Annual physical exam    -  Primary   Normal exam except for weight continue work with diet and exercise   Relevant Orders   CBC with Differential/Platelet   Comprehensive metabolic panel   Hemoglobin A1c   Lipid panel   TSH   Hepatitis C antibody   Encounter for screening mammogram for breast cancer       pt to schedule   Colon cancer screening       referral done previously she will call to schedule colonoscopy   Need for hepatitis C screening test  Relevant Orders   Hepatitis C antibody       Return in about 4 months (around 07/09/2023) for HTN.   Partially dictated using Dragon software, any errors are not intentional.  [name], MD [facility] [place], [place]

## 2023-03-09 NOTE — Assessment & Plan Note (Signed)
Clinically stable on current regimen with good control of symptoms, No SI or HI. No change in management at this time. Lexapro 10 mg. 

## 2023-03-09 NOTE — Assessment & Plan Note (Addendum)
She has stopped her Avapro due to low BP readings at home. May have felt slightly light headed but no significant symptoms. BP at home have been 125/75 Today 120/78 Will continue HCTZ alone. DASH diet.

## 2023-03-10 ENCOUNTER — Encounter: Payer: Self-pay | Admitting: Internal Medicine

## 2023-03-10 DIAGNOSIS — E785 Hyperlipidemia, unspecified: Secondary | ICD-10-CM | POA: Insufficient documentation

## 2023-03-10 LAB — CBC WITH DIFFERENTIAL/PLATELET
Basophils Absolute: 0.1 10*3/uL (ref 0.0–0.2)
Basos: 1 %
EOS (ABSOLUTE): 0.1 10*3/uL (ref 0.0–0.4)
Eos: 1 %
Hematocrit: 39.1 % (ref 34.0–46.6)
Hemoglobin: 12.7 g/dL (ref 11.1–15.9)
Immature Grans (Abs): 0 10*3/uL (ref 0.0–0.1)
Immature Granulocytes: 0 %
Lymphocytes Absolute: 2.7 10*3/uL (ref 0.7–3.1)
Lymphs: 28 %
MCH: 27.5 pg (ref 26.6–33.0)
MCHC: 32.5 g/dL (ref 31.5–35.7)
MCV: 85 fL (ref 79–97)
Monocytes Absolute: 0.5 10*3/uL (ref 0.1–0.9)
Monocytes: 5 %
Neutrophils Absolute: 6.2 10*3/uL (ref 1.4–7.0)
Neutrophils: 65 %
Platelets: 372 10*3/uL (ref 150–450)
RBC: 4.62 x10E6/uL (ref 3.77–5.28)
RDW: 14 % (ref 11.7–15.4)
WBC: 9.6 10*3/uL (ref 3.4–10.8)

## 2023-03-10 LAB — COMPREHENSIVE METABOLIC PANEL
ALT: 15 IU/L (ref 0–32)
AST: 21 IU/L (ref 0–40)
Albumin/Globulin Ratio: 1.5 (ref 1.2–2.2)
Albumin: 4.7 g/dL (ref 3.8–4.9)
Alkaline Phosphatase: 104 IU/L (ref 44–121)
BUN/Creatinine Ratio: 17 (ref 9–23)
BUN: 14 mg/dL (ref 6–24)
Bilirubin Total: 0.4 mg/dL (ref 0.0–1.2)
CO2: 26 mmol/L (ref 20–29)
Calcium: 10.3 mg/dL — ABNORMAL HIGH (ref 8.7–10.2)
Chloride: 100 mmol/L (ref 96–106)
Creatinine, Ser: 0.84 mg/dL (ref 0.57–1.00)
Globulin, Total: 3.2 g/dL (ref 1.5–4.5)
Glucose: 93 mg/dL (ref 70–99)
Potassium: 4.1 mmol/L (ref 3.5–5.2)
Sodium: 141 mmol/L (ref 134–144)
Total Protein: 7.9 g/dL (ref 6.0–8.5)
eGFR: 84 mL/min/{1.73_m2} (ref >59)

## 2023-03-10 LAB — URINALYSIS, ROUTINE W REFLEX MICROSCOPIC
Bilirubin, UA: NEGATIVE
Glucose, UA: NEGATIVE
Leukocytes,UA: NEGATIVE
Nitrite, UA: NEGATIVE
RBC, UA: NEGATIVE
Specific Gravity, UA: 1.023 (ref 1.005–1.030)
Urobilinogen, Ur: 1 mg/dL (ref 0.2–1.0)
pH, UA: 6.5 (ref 5.0–7.5)

## 2023-03-10 LAB — HEMOGLOBIN A1C
Est. average glucose Bld gHb Est-mCnc: 120 mg/dL
Hgb A1c MFr Bld: 5.8 % — ABNORMAL HIGH (ref 4.8–5.6)

## 2023-03-10 LAB — LIPID PANEL
Chol/HDL Ratio: 2.7 ratio (ref 0.0–4.4)
Cholesterol, Total: 260 mg/dL — ABNORMAL HIGH (ref 100–199)
HDL: 98 mg/dL (ref >39)
LDL Chol Calc (NIH): 154 mg/dL — ABNORMAL HIGH (ref 0–99)
Triglycerides: 53 mg/dL (ref 0–149)
VLDL Cholesterol Cal: 8 mg/dL (ref 5–40)

## 2023-03-10 LAB — TSH: TSH: 1.59 u[IU]/mL (ref 0.450–4.500)

## 2023-03-10 LAB — HEPATITIS C ANTIBODY: Hep C Virus Ab: NONREACTIVE

## 2023-03-18 ENCOUNTER — Telehealth: Payer: Self-pay

## 2023-03-18 NOTE — Telephone Encounter (Signed)
Pt left vm message to schedule colonoscopy please return call

## 2023-03-25 ENCOUNTER — Ambulatory Visit
Admission: RE | Admit: 2023-03-25 | Discharge: 2023-03-25 | Disposition: A | Discharge status: Home / Self Care | Payer: Medicaid Other | Source: Ambulatory Visit | Attending: Internal Medicine | Admitting: Internal Medicine

## 2023-03-25 DIAGNOSIS — Z1231 Encounter for screening mammogram for malignant neoplasm of breast: Secondary | ICD-10-CM | POA: Insufficient documentation

## 2023-06-09 ENCOUNTER — Other Ambulatory Visit: Payer: Self-pay | Admitting: Internal Medicine

## 2023-06-09 DIAGNOSIS — I10 Essential (primary) hypertension: Secondary | ICD-10-CM

## 2023-07-22 ENCOUNTER — Encounter: Payer: Self-pay | Admitting: Internal Medicine

## 2023-07-22 ENCOUNTER — Ambulatory Visit (INDEPENDENT_AMBULATORY_CARE_PROVIDER_SITE_OTHER): Payer: Self-pay | Admitting: Internal Medicine

## 2023-07-22 VITALS — BP 128/64 | HR 76 | Ht 62.0 in | Wt 173.0 lb

## 2023-07-22 DIAGNOSIS — I10 Essential (primary) hypertension: Secondary | ICD-10-CM

## 2023-07-22 NOTE — Progress Notes (Signed)
Date:  07/22/2023   Name:  Andrea Massey   DOB:  Mar 01, 1971   MRN:  [id number]   Chief Complaint: Hypertension  Hypertension This is a chronic problem. The problem is controlled. Pertinent negatives include no chest pain, headaches, palpitations or shortness of breath. Past treatments include diuretics. The current treatment provides significant improvement.   CRC screening - she tried to schedule a colonoscopy and was told that she could do the Cologuard but she wants the colonoscopy.  She will call GI to get that scheduled.  Lab Results  Component Value Date   NA 141 03/09/2023   K 4.1 03/09/2023   CO2 26 03/09/2023   GLUCOSE 93 03/09/2023   BUN 14 03/09/2023   CREATININE 0.84 03/09/2023   CALCIUM 10.3 (H) 03/09/2023   EGFR 84 03/09/2023   GFRNONAA >60 03/28/2020   Lab Results  Component Value Date   CHOL 260 (H) 03/09/2023   HDL 98 03/09/2023   LDLCALC 154 (H) 03/09/2023   TRIG 53 03/09/2023   CHOLHDL 2.7 03/09/2023   Lab Results  Component Value Date   TSH 1.590 03/09/2023   Lab Results  Component Value Date   HGBA1C 5.8 (H) 03/09/2023   Lab Results  Component Value Date   WBC 9.6 03/09/2023   HGB 12.7 03/09/2023   HCT 39.1 03/09/2023   MCV 85 03/09/2023   PLT 372 03/09/2023   Lab Results  Component Value Date   ALT 15 03/09/2023   AST 21 03/09/2023   ALKPHOS 104 03/09/2023   BILITOT 0.4 03/09/2023   No results found for: "25OHVITD2", "25OHVITD3", "VD25OH"   Review of Systems  Constitutional:  Negative for fatigue and unexpected weight change.  HENT:  Negative for nosebleeds.   Eyes:  Negative for visual disturbance.  Respiratory:  Negative for cough, chest tightness, shortness of breath and wheezing.   Cardiovascular:  Negative for chest pain, palpitations and leg swelling.  Gastrointestinal:  Negative for abdominal pain, constipation and diarrhea.  Neurological:  Negative for dizziness, weakness, light-headedness and headaches.     Patient Active Problem List   Diagnosis Date Noted   Mild hyperlipidemia 03/10/2023   Anxiety disorder 05/14/2020   Essential hypertension 08/31/2019   Varicose veins of right leg with edema 08/31/2019   Osteopenia 06/10/2018   Endometriosis 06/03/2016    Allergies  Allergen Reactions   Amoxicillin Hives   Penicillins Other (See Comments)    Unknown reaction    Past Surgical History:  Procedure Laterality Date   CHOLECYSTECTOMY  11/2017   HERNIA REPAIR     KNEE SURGERY     laparoscopy     LEEP     TUBAL LIGATION Bilateral 2009    Social History   Tobacco Use   Smoking status: Never   Smokeless tobacco: Never  Vaping Use   Vaping status: Never Used  Substance Use Topics   Alcohol use: Yes    Comment: occas   Drug use: No     Medication list has been reviewed and updated.  Current Meds  Medication Sig   Calcium Carbonate-Vitamin D (CALCIUM 500 + D PO) Take by mouth. 600+D   escitalopram (LEXAPRO) 10 MG tablet Take 1 tablet (10 mg total) by mouth daily.   fluticasone (FLONASE) 50 MCG/ACT nasal spray Place into the nose.   hydrochlorothiazide (HYDRODIURIL) 25 MG tablet TAKE 1 TABLET(25 MG) BY MOUTH DAILY   ipratropium (ATROVENT) 0.06 % nasal spray Place 2 sprays into both nostrils 4 (four)  times daily.   loratadine (CLARITIN) 10 MG tablet Take 10 mg by mouth daily as needed.   Multiple Vitamin (MULTIVITAMIN) capsule Take 1 capsule by mouth daily.   UNABLE TO FIND Med Name: gollie gummies       07/22/2023    1:20 PM 03/09/2023    8:38 AM 12/26/2022    2:56 PM 11/25/2022    9:26 AM  GAD 7 : Generalized Anxiety Score  Nervous, Anxious, on Edge 0 1 0 1  Control/stop worrying 0 0 0 1  Worry too much - different things 0 0 0 1  Trouble relaxing 0 1 0 1  Restless 0 0 0 0  Easily annoyed or irritable 0 0 0 1  Afraid - awful might happen 0 0 0 0  Total GAD 7 Score 0 2 0 5  Anxiety Difficulty Not difficult at all Not difficult at all Not difficult at all  Somewhat difficult       07/22/2023    1:20 PM 03/09/2023    8:38 AM 12/26/2022    2:56 PM  Depression screen PHQ 2/9  Decreased Interest 0 0 0  Down, Depressed, Hopeless 0 0 0  PHQ - 2 Score 0 0 0  Altered sleeping 0 0 1  Tired, decreased energy 0 1 0  Change in appetite 0 0 0  Feeling bad or failure about yourself  0 0 0  Trouble concentrating 0 0 0  Moving slowly or fidgety/restless 0 0 0  Suicidal thoughts 0 0 0  PHQ-9 Score 0 1 1  Difficult doing work/chores Not difficult at all Not difficult at all Not difficult at all    BP Readings from Last 3 Encounters:  07/22/23 128/64  03/09/23 120/78  12/26/22 128/78    Physical Exam Vitals and nursing note reviewed.  Constitutional:      General: She is not in acute distress.    Appearance: She is well-developed.  HENT:     Head: Normocephalic and atraumatic.  Cardiovascular:     Rate and Rhythm: Normal rate and regular rhythm.  Pulmonary:     Effort: Pulmonary effort is normal. No respiratory distress.     Breath sounds: No wheezing or rhonchi.  Musculoskeletal:        General: No swelling.     Cervical back: Normal range of motion.  Lymphadenopathy:     Cervical: No cervical adenopathy.  Skin:    General: Skin is warm and dry.     Capillary Refill: Capillary refill takes less than 2 seconds.     Findings: No rash.  Neurological:     General: No focal deficit present.     Mental Status: She is alert and oriented to person, place, and time.  Psychiatric:        Mood and Affect: Mood normal.        Behavior: Behavior normal.     Wt Readings from Last 3 Encounters:  07/22/23 173 lb (78.5 kg)  03/09/23 174 lb (78.9 kg)  12/26/22 172 lb (78 kg)    BP 128/64   Pulse 76   Ht 5\' 2"  (1.575 m)   Wt 173 lb (78.5 kg)   SpO2 98%   BMI 31.64 kg/m   Assessment and Plan:  Problem List Items Addressed This Visit       Unprioritized   Essential hypertension - Primary (Chronic)    Normal exam with stable BP on  HCTZ alone since stopping Benicar. No concerns or side  effects to current medication. No change in regimen; continue low sodium diet.        Return for CPX.    [name], MD [facility] [place]

## 2023-07-22 NOTE — Assessment & Plan Note (Signed)
Normal exam with stable BP on HCTZ alone since stopping Benicar. No concerns or side effects to current medication. No change in regimen; continue low sodium diet.

## 2023-07-23 ENCOUNTER — Encounter: Payer: Self-pay | Admitting: Internal Medicine

## 2023-07-23 ENCOUNTER — Other Ambulatory Visit: Payer: Self-pay | Admitting: Internal Medicine

## 2023-07-23 DIAGNOSIS — I10 Essential (primary) hypertension: Secondary | ICD-10-CM

## 2023-07-23 MED ORDER — HYDROCHLOROTHIAZIDE 25 MG PO TABS: 25.0000 mg | ORAL_TABLET | Freq: Every day | ORAL | 1 refills | Status: DC

## 2023-08-21 ENCOUNTER — Other Ambulatory Visit: Payer: Self-pay | Admitting: Internal Medicine

## 2023-08-21 DIAGNOSIS — Z1211 Encounter for screening for malignant neoplasm of colon: Secondary | ICD-10-CM

## 2023-08-21 DIAGNOSIS — Z1212 Encounter for screening for malignant neoplasm of rectum: Secondary | ICD-10-CM

## 2023-11-19 ENCOUNTER — Encounter: Payer: Self-pay | Admitting: Internal Medicine

## 2024-02-19 ENCOUNTER — Encounter: Payer: Self-pay | Admitting: Internal Medicine

## 2024-02-28 ENCOUNTER — Other Ambulatory Visit: Payer: Self-pay | Admitting: Internal Medicine

## 2024-02-28 DIAGNOSIS — I10 Essential (primary) hypertension: Secondary | ICD-10-CM

## 2024-03-01 NOTE — Telephone Encounter (Signed)
 Called patient to schedule appt for medication refills. No answer, VM box full, unable to leave message to call back. Last OV 07/22/23 and OV 02/19/24 was canceled (provider). Aaron Aas

## 2024-03-01 NOTE — Telephone Encounter (Signed)
 Requested by interface surescripts. Last OV 07/22/23. Called patient to schedule appt for med refills. No answer unable to leave message MV box full. Courtesy refill  Requested Prescriptions  Pending Prescriptions Disp Refills   hydrochlorothiazide (HYDRODIURIL) 25 MG tablet [Pharmacy Med Name: HYDROCHLOROTHIAZIDE 25MG  TABLETS] 30 tablet 0    Sig: TAKE 1 TABLET(25 MG) BY MOUTH DAILY     Cardiovascular: Diuretics - Thiazide Failed - 03/01/2024  9:14 AM      Failed - Cr in normal range and within 180 days    Creatinine  Date Value Ref Range Status  09/27/2012 0.72 0.60 - 1.30 mg/dL Final   Creatinine, Ser  Date Value Ref Range Status  03/09/2023 0.84 0.57 - 1.00 mg/dL Final         Failed - K in normal range and within 180 days    Potassium  Date Value Ref Range Status  03/09/2023 4.1 3.5 - 5.2 mmol/L Final  09/27/2012 3.8 3.5 - 5.1 mmol/L Final         Failed - Na in normal range and within 180 days    Sodium  Date Value Ref Range Status  03/09/2023 141 134 - 144 mmol/L Final  09/27/2012 138 136 - 145 mmol/L Final         Failed - Valid encounter within last 6 months    Recent Outpatient Visits   None            Passed - Last BP in normal range    BP Readings from Last 1 Encounters:  07/22/23 128/64

## 2024-04-16 ENCOUNTER — Other Ambulatory Visit: Payer: Self-pay | Admitting: Internal Medicine

## 2024-04-16 DIAGNOSIS — I10 Essential (primary) hypertension: Secondary | ICD-10-CM

## 2024-04-18 ENCOUNTER — Other Ambulatory Visit: Payer: Self-pay | Admitting: Internal Medicine

## 2024-04-18 DIAGNOSIS — I10 Essential (primary) hypertension: Secondary | ICD-10-CM

## 2024-04-19 ENCOUNTER — Encounter: Payer: Self-pay | Admitting: *Deleted

## 2024-04-19 NOTE — Telephone Encounter (Signed)
 Refused hydrochlorothiazide  25 mg because a 30 day supply was given on 04/18/2024 with no refills.  Pt needs an appt for further refills.   A 90 day supply is being requested.    I sent her a MyChart message to use MyChart or call and schedule her an appt with Dr. Gala Jubilee.

## 2024-04-29 ENCOUNTER — Encounter: Payer: Self-pay | Admitting: Internal Medicine

## 2024-04-29 ENCOUNTER — Ambulatory Visit (INDEPENDENT_AMBULATORY_CARE_PROVIDER_SITE_OTHER): Payer: Self-pay | Admitting: Internal Medicine

## 2024-04-29 VITALS — BP 128/86 | HR 76 | Ht 62.0 in | Wt 175.0 lb

## 2024-04-29 DIAGNOSIS — I10 Essential (primary) hypertension: Secondary | ICD-10-CM

## 2024-04-29 DIAGNOSIS — Z1211 Encounter for screening for malignant neoplasm of colon: Secondary | ICD-10-CM

## 2024-04-29 DIAGNOSIS — F419 Anxiety disorder, unspecified: Secondary | ICD-10-CM

## 2024-04-29 DIAGNOSIS — R7303 Prediabetes: Secondary | ICD-10-CM

## 2024-04-29 DIAGNOSIS — Z1231 Encounter for screening mammogram for malignant neoplasm of breast: Secondary | ICD-10-CM

## 2024-04-29 DIAGNOSIS — E785 Hyperlipidemia, unspecified: Secondary | ICD-10-CM

## 2024-04-29 MED ORDER — ESCITALOPRAM OXALATE 10 MG PO TABS: 10.0000 mg | ORAL_TABLET | Freq: Every day | ORAL | 1 refills | Status: DC

## 2024-04-29 MED ORDER — HYDROCHLOROTHIAZIDE 25 MG PO TABS: 25.0000 mg | ORAL_TABLET | Freq: Every day | ORAL | 1 refills | Status: DC

## 2024-04-29 NOTE — Patient Instructions (Signed)
 Call [facility] to schedule your mammogram at [phone].

## 2024-04-29 NOTE — Assessment & Plan Note (Signed)
 Clinically stable on lexapro  low dose.   No SI or HI on evaluation. Plan to continue same medications for now.

## 2024-04-29 NOTE — Assessment & Plan Note (Addendum)
 Blood pressure is well controlled in general but high today due to being out of medications for one week. Current medications are hydrochlorothiazide .  Resume daily dosing. Will continue same regimen along with efforts to limit dietary sodium.

## 2024-04-29 NOTE — Progress Notes (Signed)
 Date:  04/29/2024   Name:  Andrea Massey   DOB:  01/15/1971   MRN:  [id number]   Chief Complaint: Hypertension  Hypertension This is a chronic problem. The problem is unchanged. The problem is controlled. Associated symptoms include anxiety. Pertinent negatives include no chest pain, headaches, palpitations or shortness of breath. Past treatments include diuretics. The current treatment provides significant improvement. There is no history of kidney disease, CAD/MI or CVA.  Hyperlipidemia This is a chronic problem. Recent lipid tests were reviewed and are variable. Pertinent negatives include no chest pain, myalgias or shortness of breath. Current antihyperlipidemic treatment includes exercise and diet change.  Anxiety Presents for follow-up visit. Patient reports no chest pain, dizziness, nervous/anxious behavior, palpitations or shortness of breath. The severity of symptoms is mild. The quality of sleep is good.   Compliance with medications is 76-100% (taking Lexapro  5 mg).    Review of Systems  Constitutional:  Negative for fatigue and unexpected weight change.  HENT:  Negative for trouble swallowing.   Eyes:  Negative for visual disturbance.  Respiratory:  Negative for cough, chest tightness, shortness of breath and wheezing.   Cardiovascular:  Negative for chest pain, palpitations and leg swelling.  Gastrointestinal:  Negative for abdominal pain, constipation and diarrhea.  Musculoskeletal:  Negative for arthralgias and myalgias.  Neurological:  Negative for dizziness, weakness, light-headedness and headaches.  Psychiatric/Behavioral:  Negative for dysphoric mood and sleep disturbance. The patient is not nervous/anxious.      Lab Results  Component Value Date   NA 141 03/09/2023   K 4.1 03/09/2023   CO2 26 03/09/2023   GLUCOSE 93 03/09/2023   BUN 14 03/09/2023   CREATININE 0.84 03/09/2023   CALCIUM 10.3 (H) 03/09/2023   EGFR 84 03/09/2023   GFRNONAA >60  03/28/2020   Lab Results  Component Value Date   CHOL 260 (H) 03/09/2023   HDL 98 03/09/2023   LDLCALC 154 (H) 03/09/2023   TRIG 53 03/09/2023   CHOLHDL 2.7 03/09/2023   Lab Results  Component Value Date   TSH 1.590 03/09/2023   Lab Results  Component Value Date   HGBA1C 5.8 (H) 03/09/2023   Lab Results  Component Value Date   WBC 9.6 03/09/2023   HGB 12.7 03/09/2023   HCT 39.1 03/09/2023   MCV 85 03/09/2023   PLT 372 03/09/2023   Lab Results  Component Value Date   ALT 15 03/09/2023   AST 21 03/09/2023   ALKPHOS 104 03/09/2023   BILITOT 0.4 03/09/2023   No results found for: Lucetta Russel, VD25OH   Patient Active Problem List   Diagnosis Date Noted   Mild hyperlipidemia 03/10/2023   Anxiety disorder 05/14/2020   Essential hypertension 08/31/2019   Varicose veins of right leg with edema 08/31/2019   Osteopenia 06/10/2018   Endometriosis 06/03/2016    Allergies  Allergen Reactions   Amoxicillin Hives   Penicillins Other (See Comments)    Unknown reaction    Past Surgical History:  Procedure Laterality Date   CHOLECYSTECTOMY  11/2017   HERNIA REPAIR     KNEE SURGERY     laparoscopy     LEEP     TUBAL LIGATION Bilateral 2009    Social History   Tobacco Use   Smoking status: Never   Smokeless tobacco: Never  Vaping Use   Vaping status: Never Used  Substance Use Topics   Alcohol use: Yes    Comment: occas   Drug use: No  Medication list has been reviewed and updated.  Current Meds  Medication Sig   Calcium Carbonate-Vitamin D (CALCIUM 500 + D PO) Take by mouth. 600+D   fluticasone (FLONASE) 50 MCG/ACT nasal spray Place into the nose.   loratadine (CLARITIN) 10 MG tablet Take 10 mg by mouth daily as needed.   Multiple Vitamin (MULTIVITAMIN) capsule Take 1 capsule by mouth daily.   UNABLE TO FIND Med Name: gollie gummies   [DISCONTINUED] escitalopram  (LEXAPRO ) 10 MG tablet Take 1 tablet (10 mg total) by mouth daily.    [DISCONTINUED] hydrochlorothiazide  (HYDRODIURIL ) 25 MG tablet TAKE 1 TABLET(25 MG) BY MOUTH DAILY       04/29/2024    2:23 PM 07/22/2023    1:20 PM 03/09/2023    8:38 AM 12/26/2022    2:56 PM  GAD 7 : Generalized Anxiety Score  Nervous, Anxious, on Edge 0 0 1 0  Control/stop worrying 0 0 0 0  Worry too much - different things 0 0 0 0  Trouble relaxing 0 0 1 0  Restless 0 0 0 0  Easily annoyed or irritable 0 0 0 0  Afraid - awful might happen 0 0 0 0  Total GAD 7 Score 0 0 2 0  Anxiety Difficulty Not difficult at all Not difficult at all Not difficult at all Not difficult at all       04/29/2024    2:23 PM 07/22/2023    1:20 PM 03/09/2023    8:38 AM  Depression screen PHQ 2/9  Decreased Interest 0 0 0  Down, Depressed, Hopeless 0 0 0  PHQ - 2 Score 0 0 0  Altered sleeping 0 0 0  Tired, decreased energy 1 0 1  Change in appetite 0 0 0  Feeling bad or failure about yourself  0 0 0  Trouble concentrating 0 0 0  Moving slowly or fidgety/restless 0 0 0  Suicidal thoughts 0 0 0  PHQ-9 Score 1 0 1  Difficult doing work/chores Not difficult at all Not difficult at all Not difficult at all    BP Readings from Last 3 Encounters:  04/29/24 128/86  07/22/23 128/64  03/09/23 120/78    Physical Exam Vitals and nursing note reviewed.  Constitutional:      General: She is not in acute distress.    Appearance: Normal appearance. She is well-developed.  HENT:     Head: Normocephalic and atraumatic.  Neck:     Vascular: No carotid bruit.   Cardiovascular:     Rate and Rhythm: Normal rate and regular rhythm.     Heart sounds: No murmur heard. Pulmonary:     Effort: Pulmonary effort is normal. No respiratory distress.     Breath sounds: No wheezing or rhonchi.   Musculoskeletal:     Cervical back: Normal range of motion.     Right lower leg: No edema.     Left lower leg: No edema.  Lymphadenopathy:     Cervical: No cervical adenopathy.   Skin:    General: Skin is warm and dry.      Findings: No rash.   Neurological:     General: No focal deficit present.     Mental Status: She is alert and oriented to person, place, and time.   Psychiatric:        Mood and Affect: Mood normal.        Behavior: Behavior normal.     Wt Readings from Last 3 Encounters:  04/29/24 175  lb (79.4 kg)  07/22/23 173 lb (78.5 kg)  03/09/23 174 lb (78.9 kg)    BP 128/86   Pulse 76   Ht 5' 2 (1.575 m)   Wt 175 lb (79.4 kg)   SpO2 100%   BMI 32.01 kg/m   Assessment and Plan:  Problem List Items Addressed This Visit       Unprioritized   Essential hypertension - Primary (Chronic)   Blood pressure is well controlled in general but high today due to being out of medications for one week. Current medications are hydrochlorothiazide .  Resume daily dosing. Will continue same regimen along with efforts to limit dietary sodium.       Relevant Medications   hydrochlorothiazide  (HYDRODIURIL ) 25 MG tablet   Other Relevant Orders   CBC with Differential/Platelet   Comprehensive metabolic panel with GFR   TSH   Anxiety disorder (Chronic)   Clinically stable on lexapro  low dose.   No SI or HI on evaluation. Plan to continue same medications for now.       Relevant Medications   escitalopram  (LEXAPRO ) 10 MG tablet   Mild hyperlipidemia   Relevant Medications   hydrochlorothiazide  (HYDRODIURIL ) 25 MG tablet   Other Relevant Orders   Lipid panel   Other Visit Diagnoses       Encounter for screening mammogram for breast cancer       Relevant Orders   MM 3D SCREENING MAMMOGRAM BILATERAL BREAST     Colon cancer screening       Relevant Orders   Cologuard     Prediabetes       Relevant Orders   Hemoglobin A1c       Return in about 6 months (around 10/29/2024) for HTN with Dr. Rolena Click or Me.    Sheron Dixons, MD [facility] [place]

## 2024-05-04 ENCOUNTER — Encounter: Payer: Self-pay | Admitting: Internal Medicine

## 2024-05-07 ENCOUNTER — Other Ambulatory Visit: Payer: Self-pay | Admitting: Internal Medicine

## 2024-05-07 ENCOUNTER — Ambulatory Visit: Payer: Self-pay | Admitting: Internal Medicine

## 2024-05-07 DIAGNOSIS — E785 Hyperlipidemia, unspecified: Secondary | ICD-10-CM

## 2024-05-07 LAB — LIPID PANEL
Chol/HDL Ratio: 3 ratio (ref 0.0–4.4)
Cholesterol, Total: 258 mg/dL — ABNORMAL HIGH (ref 100–199)
HDL: 86 mg/dL (ref >39)
LDL Chol Calc (NIH): 163 mg/dL — ABNORMAL HIGH (ref 0–99)
Triglycerides: 60 mg/dL (ref 0–149)
VLDL Cholesterol Cal: 9 mg/dL (ref 5–40)

## 2024-05-07 LAB — CBC WITH DIFFERENTIAL/PLATELET
Basophils Absolute: 0.1 10*3/uL (ref 0.0–0.2)
Basos: 1 %
EOS (ABSOLUTE): 0.1 10*3/uL (ref 0.0–0.4)
Eos: 1 %
Hematocrit: 38 % (ref 34.0–46.6)
Hemoglobin: 11.9 g/dL (ref 11.1–15.9)
Immature Grans (Abs): 0 10*3/uL (ref 0.0–0.1)
Immature Granulocytes: 0 %
Lymphocytes Absolute: 3.3 10*3/uL — ABNORMAL HIGH (ref 0.7–3.1)
Lymphs: 35 %
MCH: 27.7 pg (ref 26.6–33.0)
MCHC: 31.3 g/dL — ABNORMAL LOW (ref 31.5–35.7)
MCV: 88 fL (ref 79–97)
Monocytes Absolute: 0.6 10*3/uL (ref 0.1–0.9)
Monocytes: 6 %
Neutrophils Absolute: 5.4 10*3/uL (ref 1.4–7.0)
Neutrophils: 57 %
Platelets: 314 10*3/uL (ref 150–450)
RBC: 4.3 x10E6/uL (ref 3.77–5.28)
RDW: 14.6 % (ref 11.7–15.4)
WBC: 9.5 10*3/uL (ref 3.4–10.8)

## 2024-05-07 LAB — COMPREHENSIVE METABOLIC PANEL WITH GFR
ALT: 13 IU/L (ref 0–32)
AST: 22 IU/L (ref 0–40)
Albumin: 4.6 g/dL (ref 3.8–4.9)
Alkaline Phosphatase: 94 IU/L (ref 44–121)
BUN/Creatinine Ratio: 19 (ref 9–23)
BUN: 16 mg/dL (ref 6–24)
Bilirubin Total: 0.4 mg/dL (ref 0.0–1.2)
CO2: 26 mmol/L (ref 20–29)
Calcium: 9.7 mg/dL (ref 8.7–10.2)
Chloride: 99 mmol/L (ref 96–106)
Creatinine, Ser: 0.83 mg/dL (ref 0.57–1.00)
Globulin, Total: 2.9 g/dL (ref 1.5–4.5)
Glucose: 87 mg/dL (ref 70–99)
Potassium: 3.9 mmol/L (ref 3.5–5.2)
Sodium: 139 mmol/L (ref 134–144)
Total Protein: 7.5 g/dL (ref 6.0–8.5)
eGFR: 84 mL/min/{1.73_m2} (ref >59)

## 2024-05-07 LAB — HEMOGLOBIN A1C
Est. average glucose Bld gHb Est-mCnc: 120 mg/dL
Hgb A1c MFr Bld: 5.8 % — ABNORMAL HIGH (ref 4.8–5.6)

## 2024-05-07 LAB — TSH: TSH: 1.69 u[IU]/mL (ref 0.450–4.500)

## 2024-05-07 NOTE — Progress Notes (Unsigned)
 Date:  05/07/2024   Name:  Andrea Massey   DOB:  [birth year]   MRN:  [id number]   Chief Complaint: No chief complaint on file.  HPI  Review of Systems   Lab Results  Component Value Date   NA 139 05/06/2024   K 3.9 05/06/2024   CO2 26 05/06/2024   GLUCOSE 87 05/06/2024   BUN 16 05/06/2024   CREATININE 0.83 05/06/2024   CALCIUM 9.7 05/06/2024   EGFR 84 05/06/2024   GFRNONAA >60 03/28/2020   Lab Results  Component Value Date   CHOL 258 (H) 05/06/2024   HDL 86 05/06/2024   LDLCALC 163 (H) 05/06/2024   TRIG 60 05/06/2024   CHOLHDL 3.0 05/06/2024   Lab Results  Component Value Date   TSH 1.690 05/06/2024   Lab Results  Component Value Date   HGBA1C 5.8 (H) 05/06/2024   Lab Results  Component Value Date   WBC 9.5 05/06/2024   HGB 11.9 05/06/2024   HCT 38.0 05/06/2024   MCV 88 05/06/2024   PLT 314 05/06/2024   Lab Results  Component Value Date   ALT 13 05/06/2024   AST 22 05/06/2024   ALKPHOS 94 05/06/2024   BILITOT 0.4 05/06/2024   No results found for: MARIEN BOLLS, VD25OH   Patient Active Problem List   Diagnosis Date Noted   Mild hyperlipidemia 03/10/2023   Anxiety disorder 05/14/2020   Essential hypertension 08/31/2019   Varicose veins of right leg with edema 08/31/2019   Osteopenia 06/10/2018   Endometriosis 06/03/2016    Allergies  Allergen Reactions   Amoxicillin Hives   Penicillins Other (See Comments)    Unknown reaction    Past Surgical History:  Procedure Laterality Date   CHOLECYSTECTOMY  11/2017   HERNIA REPAIR     KNEE SURGERY     laparoscopy     LEEP     TUBAL LIGATION Bilateral 2009    Social History   Tobacco Use   Smoking status: Never   Smokeless tobacco: Never  Vaping Use   Vaping status: Never Used  Substance Use Topics   Alcohol use: Yes    Comment: occas   Drug use: No     Medication list has been reviewed and updated.  No outpatient medications have been marked as taking for  the 05/07/24 encounter (Orders Only) with Justus Leita DEL, MD.       04/29/2024    2:23 PM 07/22/2023    1:20 PM 03/09/2023    8:38 AM 12/26/2022    2:56 PM  GAD 7 : Generalized Anxiety Score  Nervous, Anxious, on Edge 0 0 1 0  Control/stop worrying 0 0 0 0  Worry too much - different things 0 0 0 0  Trouble relaxing 0 0 1 0  Restless 0 0 0 0  Easily annoyed or irritable 0 0 0 0  Afraid - awful might happen 0 0 0 0  Total GAD 7 Score 0 0 2 0  Anxiety Difficulty Not difficult at all Not difficult at all Not difficult at all Not difficult at all       04/29/2024    2:23 PM 07/22/2023    1:20 PM 03/09/2023    8:38 AM  Depression screen PHQ 2/9  Decreased Interest 0 0 0  Down, Depressed, Hopeless 0 0 0  PHQ - 2 Score 0 0 0  Altered sleeping 0 0 0  Tired, decreased energy 1 0 1  Change in  appetite 0 0 0  Feeling bad or failure about yourself  0 0 0  Trouble concentrating 0 0 0  Moving slowly or fidgety/restless 0 0 0  Suicidal thoughts 0 0 0  PHQ-9 Score 1 0 1  Difficult doing work/chores Not difficult at all Not difficult at all Not difficult at all    BP Readings from Last 3 Encounters:  04/29/24 128/86  07/22/23 128/64  03/09/23 120/78    Physical Exam  Wt Readings from Last 3 Encounters:  04/29/24 175 lb (79.4 kg)  07/22/23 173 lb (78.5 kg)  03/09/23 174 lb (78.9 kg)    There were no vitals taken for this visit.  Assessment and Plan:  Problem List Items Addressed This Visit   None   No follow-ups on file.    Leita HILARIO Adie, MD [facility] [place]

## 2024-10-22 ENCOUNTER — Other Ambulatory Visit: Payer: Self-pay | Admitting: Internal Medicine

## 2024-10-22 DIAGNOSIS — I10 Essential (primary) hypertension: Secondary | ICD-10-CM

## 2024-10-23 ENCOUNTER — Other Ambulatory Visit: Payer: Self-pay | Admitting: Internal Medicine

## 2024-10-23 DIAGNOSIS — I10 Essential (primary) hypertension: Secondary | ICD-10-CM

## 2024-10-25 NOTE — Telephone Encounter (Signed)
 Requested Prescriptions  Pending Prescriptions Disp Refills   hydrochlorothiazide  (HYDRODIURIL ) 25 MG tablet [Pharmacy Med Name: HYDROCHLOROTHIAZIDE  25MG  TABLETS] 90 tablet 0    Sig: TAKE 1 TABLET(25 MG) BY MOUTH DAILY     Cardiovascular: Diuretics - Thiazide Passed - 10/25/2024 11:28 AM      Passed - Cr in normal range and within 180 days    Creatinine  Date Value Ref Range Status  09/27/2012 0.72 0.60 - 1.30 mg/dL Final   Creatinine, Ser  Date Value Ref Range Status  05/06/2024 0.83 0.57 - 1.00 mg/dL Final         Passed - K in normal range and within 180 days    Potassium  Date Value Ref Range Status  05/06/2024 3.9 3.5 - 5.2 mmol/L Final  09/27/2012 3.8 3.5 - 5.1 mmol/L Final         Passed - Na in normal range and within 180 days    Sodium  Date Value Ref Range Status  05/06/2024 139 134 - 144 mmol/L Final  09/27/2012 138 136 - 145 mmol/L Final         Passed - Last BP in normal range    BP Readings from Last 1 Encounters:  04/29/24 128/86         Passed - Valid encounter within last 6 months    Recent Outpatient Visits           5 months ago Essential hypertension   [place] [facility] at [facility], Leita DEL, MD

## 2024-10-25 NOTE — Telephone Encounter (Signed)
 Requested Prescriptions  Refused Prescriptions Disp Refills   hydrochlorothiazide  (HYDRODIURIL ) 25 MG tablet [Pharmacy Med Name: HYDROCHLOROTHIAZIDE  25MG  TABLETS] 90 tablet 1    Sig: TAKE 1 TABLET(25 MG) BY MOUTH DAILY     Cardiovascular: Diuretics - Thiazide Passed - 10/25/2024  2:46 PM      Passed - Cr in normal range and within 180 days    Creatinine  Date Value Ref Range Status  09/27/2012 0.72 0.60 - 1.30 mg/dL Final   Creatinine, Ser  Date Value Ref Range Status  05/06/2024 0.83 0.57 - 1.00 mg/dL Final         Passed - K in normal range and within 180 days    Potassium  Date Value Ref Range Status  05/06/2024 3.9 3.5 - 5.2 mmol/L Final  09/27/2012 3.8 3.5 - 5.1 mmol/L Final         Passed - Na in normal range and within 180 days    Sodium  Date Value Ref Range Status  05/06/2024 139 134 - 144 mmol/L Final  09/27/2012 138 136 - 145 mmol/L Final         Passed - Last BP in normal range    BP Readings from Last 1 Encounters:  04/29/24 128/86         Passed - Valid encounter within last 6 months    Recent Outpatient Visits           5 months ago Essential hypertension   [place] [facility] at [facility], Leita DEL, MD

## 2024-10-31 ENCOUNTER — Encounter: Payer: Self-pay | Admitting: Student

## 2024-10-31 ENCOUNTER — Ambulatory Visit (INDEPENDENT_AMBULATORY_CARE_PROVIDER_SITE_OTHER): Payer: Self-pay | Admitting: Student

## 2024-10-31 VITALS — BP 128/84 | HR 80 | Ht 62.0 in | Wt 174.0 lb

## 2024-10-31 DIAGNOSIS — F419 Anxiety disorder, unspecified: Secondary | ICD-10-CM

## 2024-10-31 DIAGNOSIS — E6609 Other obesity due to excess calories: Secondary | ICD-10-CM

## 2024-10-31 DIAGNOSIS — M85851 Other specified disorders of bone density and structure, right thigh: Secondary | ICD-10-CM

## 2024-10-31 DIAGNOSIS — E785 Hyperlipidemia, unspecified: Secondary | ICD-10-CM

## 2024-10-31 DIAGNOSIS — I10 Essential (primary) hypertension: Secondary | ICD-10-CM

## 2024-10-31 DIAGNOSIS — R7303 Prediabetes: Secondary | ICD-10-CM | POA: Insufficient documentation

## 2024-10-31 DIAGNOSIS — E66811 Obesity, class 1: Secondary | ICD-10-CM

## 2024-10-31 DIAGNOSIS — E782 Mixed hyperlipidemia: Secondary | ICD-10-CM

## 2024-10-31 DIAGNOSIS — Z6831 Body mass index (BMI) 31.0-31.9, adult: Secondary | ICD-10-CM

## 2024-10-31 DIAGNOSIS — E669 Obesity, unspecified: Secondary | ICD-10-CM | POA: Insufficient documentation

## 2024-10-31 MED ORDER — HYDROCHLOROTHIAZIDE 25 MG PO TABS: 25.0000 mg | ORAL_TABLET | Freq: Every day | ORAL | 1 refills | Status: AC

## 2024-10-31 MED ORDER — ESCITALOPRAM OXALATE 5 MG PO TABS: 5.0000 mg | ORAL_TABLET | Freq: Every day | ORAL | 0 refills | Status: AC

## 2024-10-31 NOTE — Assessment & Plan Note (Signed)
 Lab Results  Component Value Date   HGBA1C 5.8 (H) 05/06/2024   HGBA1C 5.8 (H) 03/09/2023   HGBA1C 5.6 06/14/2018   Repeat in 6 months. Discussed dietary and exercise recommendation. Referral to RD.

## 2024-10-31 NOTE — Assessment & Plan Note (Addendum)
 Exercises with walks and stationary bike. Has been more busy with part time a job and only able to exercise about 2-3 times a week currently. Has not been doing weight bearing exercises. Has home equipment. She is working on reducing working hours and will increase to working out about 5 days a week and incorporate runner, broadcasting/film/video. Does track calories and feels she is eating healthy, avoiding saturated fats and eating whole grains. Will make referral to RD.

## 2024-10-31 NOTE — Progress Notes (Signed)
 Established Patient Office Visit  Subjective   Patient ID: Andrea Massey, female    DOB: 1971/06/24  Age: 53 y.o. MRN: [id number]  Chief Complaint  Patient presents with   Hypertension    Andrea Massey is a 53 y.o. person with medical hx listed below who presents today for transfer of care. Previously seeing Dr. Justus. Here for follow up of hypertension today.    Patient Active Problem List   Diagnosis Date Noted   Prediabetes 10/31/2024   Obesity 10/31/2024   Mild hyperlipidemia 03/10/2023   Anxiety disorder 05/14/2020   Essential hypertension 08/31/2019   Varicose veins of right leg with edema 08/31/2019   Osteopenia 06/10/2018   Endometriosis 06/03/2016      ROS Refer to HPI    Objective:     Outpatient Encounter Medications as of 10/31/2024  Medication Sig Note   Calcium Carbonate-Vitamin D (CALCIUM 500 + D PO) Take by mouth. 600+D    escitalopram  (LEXAPRO ) 5 MG tablet Take 1 tablet (5 mg total) by mouth daily.    fluticasone (FLONASE) 50 MCG/ACT nasal spray Place into the nose. 06/03/2016: Received from: [facility]   loratadine (CLARITIN) 10 MG tablet Take 10 mg by mouth daily as needed.    Multiple Vitamin (MULTIVITAMIN) capsule Take 1 capsule by mouth daily.    UNABLE TO FIND Med Name: gollie gummies    [DISCONTINUED] escitalopram  (LEXAPRO ) 10 MG tablet Take 1 tablet (10 mg total) by mouth daily. (Patient taking differently: Take 5 mg by mouth daily.)    [DISCONTINUED] hydrochlorothiazide  (HYDRODIURIL ) 25 MG tablet TAKE 1 TABLET(25 MG) BY MOUTH DAILY    hydrochlorothiazide  (HYDRODIURIL ) 25 MG tablet Take 1 tablet (25 mg total) by mouth daily.    No facility-administered encounter medications on file as of 10/31/2024.    BP 128/84   Pulse 80   Ht 5' 2 (1.575 m)   Wt 174 lb (78.9 kg)   SpO2 96%   BMI 31.83 kg/m  BP Readings from Last 3 Encounters:  10/31/24 128/84  04/29/24 128/86  07/22/23 128/64    Physical  Exam Constitutional:      Appearance: Normal appearance.  HENT:     Head: Normocephalic and atraumatic.     Mouth/Throat:     Mouth: Mucous membranes are moist.     Pharynx: Oropharynx is clear.  Cardiovascular:     Rate and Rhythm: Normal rate and regular rhythm.     Heart sounds: No murmur heard. Pulmonary:     Effort: Pulmonary effort is normal.     Breath sounds: No rhonchi or rales.  Abdominal:     General: Abdomen is flat. Bowel sounds are normal. There is no distension.     Palpations: Abdomen is soft.     Tenderness: There is no abdominal tenderness.  Musculoskeletal:        General: Normal range of motion.     Right lower leg: No edema.     Left lower leg: No edema.  Skin:    General: Skin is warm and dry.     Capillary Refill: Capillary refill takes less than 2 seconds.  Neurological:     General: No focal deficit present.     Mental Status: She is alert and oriented to person, place, and time.  Psychiatric:        Mood and Affect: Mood normal.        Behavior: Behavior normal.        10/31/2024  11:04 AM 04/29/2024    2:23 PM 07/22/2023    1:20 PM  Depression screen PHQ 2/9  Decreased Interest 0 0 0  Down, Depressed, Hopeless 0 0 0  PHQ - 2 Score 0 0 0  Altered sleeping  0 0  Tired, decreased energy  1 0  Change in appetite  0 0  Feeling bad or failure about yourself   0 0  Trouble concentrating  0 0  Moving slowly or fidgety/restless  0 0  Suicidal thoughts  0 0  PHQ-9 Score  1  0   Difficult doing work/chores  Not difficult at all Not difficult at all     Data saved with a previous flowsheet row definition       10/31/2024   11:04 AM 04/29/2024    2:23 PM 07/22/2023    1:20 PM 03/09/2023    8:38 AM  GAD 7 : Generalized Anxiety Score  Nervous, Anxious, on Edge 0 0 0 1  Control/stop worrying 0 0 0 0  Worry too much - different things 0 0 0 0  Trouble relaxing 0 0 0 1  Restless 0 0 0 0  Easily annoyed or irritable 0 0 0 0  Afraid - awful might  happen 0 0 0 0  Total GAD 7 Score 0 0 0 2  Anxiety Difficulty Not difficult at all Not difficult at all Not difficult at all Not difficult at all    No results found for any visits on 10/31/24.  Last CBC Lab Results  Component Value Date   WBC 9.5 05/06/2024   HGB 11.9 05/06/2024   HCT 38.0 05/06/2024   MCV 88 05/06/2024   MCH 27.7 05/06/2024   RDW 14.6 05/06/2024   PLT 314 05/06/2024   Last metabolic panel Lab Results  Component Value Date   GLUCOSE 87 05/06/2024   NA 139 05/06/2024   K 3.9 05/06/2024   CL 99 05/06/2024   CO2 26 05/06/2024   BUN 16 05/06/2024   CREATININE 0.83 05/06/2024   EGFR 84 05/06/2024   CALCIUM 9.7 05/06/2024   PROT 7.5 05/06/2024   ALBUMIN 4.6 05/06/2024   LABGLOB 2.9 05/06/2024   AGRATIO 1.5 03/09/2023   BILITOT 0.4 05/06/2024   ALKPHOS 94 05/06/2024   AST 22 05/06/2024   ALT 13 05/06/2024   ANIONGAP 9 03/28/2020   Last lipids Lab Results  Component Value Date   CHOL 258 (H) 05/06/2024   HDL 86 05/06/2024   LDLCALC 163 (H) 05/06/2024   TRIG 60 05/06/2024   CHOLHDL 3.0 05/06/2024   Last hemoglobin A1c Lab Results  Component Value Date   HGBA1C 5.8 (H) 05/06/2024      The 10-year ASCVD risk score (Arnett DK, et al., 2019) is: 3.6%    Assessment & Plan:  Essential hypertension Assessment & Plan: Well controlled on hydrochlorothiazide  25 mg  daily. CMP today.   Orders: -     hydroCHLOROthiazide ; Take 1 tablet (25 mg total) by mouth daily.  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 Does take calcium and vitamin but not regularly. Discussed regularly supplementing. Discussed repeat DXA, she would like to hold off until after she changes insurance to see if this is covered.  -Continue CA, vitamin  D  supplementation  -Weight bearing exercise   Class 1 obesity due to excess calories without serious comorbidity with body mass index (BMI) of 31.0 to 31.9 in adult Assessment & Plan: Exercises with walks and stationary bike. Has been more busy with part time a job and only able to exercise about 2-3 times a week currently. Has not been doing weight bearing exercises. Has home equipment. She is working on reducing working hours and will increase to working out about 5 days a week and incorporate runner, broadcasting/film/video. Does track calories and feels she is eating healthy, avoiding saturated fats and eating whole grains. Will make referral to RD.     Orders: -     Referral to Nutrition and Diabetes Services  Anxiety disorder, unspecified type Assessment & Plan: Stable on low dose lexapro  5 mg daily, PHQ9 0.    Moderate mixed hyperlipidemia not requiring statin therapy -     Referral to Nutrition and Diabetes Services  Prediabetes Assessment & Plan: Lab Results  Component Value Date   HGBA1C 5.8 (H) 05/06/2024   HGBA1C 5.8 (H) 03/09/2023   HGBA1C 5.6 06/14/2018   Repeat in 6 months. Discussed dietary and exercise recommendation. Referral to RD.    Mild hyperlipidemia Assessment & Plan: Elevated in June 2025, low ASCVD risk, RD referral to discuss nutrition management. Repeat at next visit.    Other orders -     Escitalopram  Oxalate; Take 1 tablet (5 mg total) by mouth daily.  Dispense: 90 tablet; Refill: 0     Return in about 6 months (around 05/01/2025) for physical.    Harlene Saddler, MD

## 2024-10-31 NOTE — Assessment & Plan Note (Signed)
 Stable on low dose lexapro  5 mg daily, PHQ9 0.

## 2024-10-31 NOTE — Assessment & Plan Note (Addendum)
 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 Does take calcium and vitamin but not regularly. Discussed regularly supplementing. Discussed repeat DXA, she would like to hold off until after she changes insurance to see if this is covered.  -Continue CA, vitamin D supplementation  -Weight bearing exercise

## 2024-10-31 NOTE — Assessment & Plan Note (Signed)
 Well controlled on hydrochlorothiazide  25 mg  daily. CMP today.

## 2024-10-31 NOTE — Assessment & Plan Note (Signed)
 Elevated in June 2025, low ASCVD risk, RD referral to discuss nutrition management. Repeat at next visit.

## 2024-11-03 LAB — COMPREHENSIVE METABOLIC PANEL WITH GFR
ALT: 19 IU/L (ref 0–32)
AST: 28 IU/L (ref 0–40)
Albumin: 4.8 g/dL (ref 3.8–4.9)
Alkaline Phosphatase: 91 IU/L (ref 49–135)
BUN/Creatinine Ratio: 17 (ref 9–23)
BUN: 13 mg/dL (ref 6–24)
Bilirubin Total: 0.4 mg/dL (ref 0.0–1.2)
CO2: 29 mmol/L (ref 20–29)
Calcium: 9.8 mg/dL (ref 8.7–10.2)
Chloride: 98 mmol/L (ref 96–106)
Creatinine, Ser: 0.78 mg/dL (ref 0.57–1.00)
Globulin, Total: 2.9 g/dL (ref 1.5–4.5)
Glucose: 86 mg/dL (ref 70–99)
Potassium: 4 mmol/L (ref 3.5–5.2)
Sodium: 139 mmol/L (ref 134–144)
Total Protein: 7.7 g/dL (ref 6.0–8.5)
eGFR: 90 mL/min/1.73 (ref >59)

## 2024-11-04 ENCOUNTER — Ambulatory Visit: Payer: Self-pay | Admitting: Student

## 2024-11-20 ENCOUNTER — Other Ambulatory Visit: Payer: Self-pay | Admitting: Internal Medicine

## 2024-11-20 DIAGNOSIS — F419 Anxiety disorder, unspecified: Secondary | ICD-10-CM

## 2024-11-22 NOTE — Telephone Encounter (Signed)
 Requested Prescriptions  Refused Prescriptions Disp Refills   escitalopram  (LEXAPRO ) 10 MG tablet [Pharmacy Med Name: ESCITALOPRAM  10MG  TABLETS] 90 tablet 1    Sig: TAKE 1 TABLET(10 MG) BY MOUTH DAILY     Psychiatry:  Antidepressants - SSRI Passed - 11/22/2024 11:02 AM      Passed - Valid encounter within last 6 months    Recent Outpatient Visits           3 weeks ago Essential hypertension   [facility], [place]   6 months ago Essential hypertension   [facility] at [facility], Leita DEL, MD

## 2025-05-10 ENCOUNTER — Encounter: Payer: Self-pay | Admitting: Student
